# Patient Record
Sex: Female | Born: 1960 | Race: White | Hispanic: No | Marital: Married | Smoking: Never smoker
Health system: Southern US, Community
[De-identification: ages and names within clinical notes are randomized; demographics above are authoritative.]

## PROBLEM LIST (undated history)

## (undated) DIAGNOSIS — F419 Anxiety disorder, unspecified: Secondary | ICD-10-CM

## (undated) DIAGNOSIS — M1990 Unspecified osteoarthritis, unspecified site: Secondary | ICD-10-CM

## (undated) DIAGNOSIS — E785 Hyperlipidemia, unspecified: Secondary | ICD-10-CM

## (undated) DIAGNOSIS — R011 Cardiac murmur, unspecified: Secondary | ICD-10-CM

## (undated) DIAGNOSIS — E063 Autoimmune thyroiditis: Secondary | ICD-10-CM

## (undated) DIAGNOSIS — F32A Depression, unspecified: Secondary | ICD-10-CM

## (undated) DIAGNOSIS — I4510 Unspecified right bundle-branch block: Secondary | ICD-10-CM

## (undated) HISTORY — DX: Cardiac murmur, unspecified: R01.1

## (undated) HISTORY — PX: PARTIAL HYSTERECTOMY: SHX80

## (undated) HISTORY — PX: SPINAL FUSION: SHX223

## (undated) HISTORY — PX: ABDOMINAL HYSTERECTOMY: SHX81

## (undated) HISTORY — PX: OTHER SURGICAL HISTORY: SHX169

## (undated) HISTORY — DX: Hyperlipidemia, unspecified: E78.5

## (undated) HISTORY — PX: APPENDECTOMY: SHX54

## (undated) HISTORY — DX: Unspecified right bundle-branch block: I45.10

## (undated) HISTORY — PX: TOTAL HIP ARTHROPLASTY: SHX124

---

## 2011-03-07 DIAGNOSIS — I4510 Unspecified right bundle-branch block: Secondary | ICD-10-CM

## 2011-03-07 HISTORY — DX: Unspecified right bundle-branch block: I45.10

## 2013-03-13 HISTORY — PX: TOTAL HIP ARTHROPLASTY: SHX124

## 2015-11-18 HISTORY — PX: HIP HARDWARE REMOVAL: SUR1127

## 2016-03-03 HISTORY — PX: REIMPLANTATION OF TOTAL HIP: SHX6051

## 2016-09-06 ENCOUNTER — Emergency Department (HOSPITAL_COMMUNITY)
Admission: EM | Admit: 2016-09-06 | Discharge: 2016-09-06 | Disposition: A | Discharge status: Home / Self Care | Payer: BLUE CROSS/BLUE SHIELD | Attending: Emergency Medicine | Admitting: Emergency Medicine

## 2016-09-06 ENCOUNTER — Encounter (HOSPITAL_COMMUNITY): Payer: Self-pay | Admitting: Vascular Surgery

## 2016-09-06 DIAGNOSIS — S51811A Laceration without foreign body of right forearm, initial encounter: Secondary | ICD-10-CM | POA: Insufficient documentation

## 2016-09-06 DIAGNOSIS — Z23 Encounter for immunization: Secondary | ICD-10-CM | POA: Insufficient documentation

## 2016-09-06 DIAGNOSIS — W19XXXA Unspecified fall, initial encounter: Secondary | ICD-10-CM | POA: Diagnosis not present

## 2016-09-06 DIAGNOSIS — Y998 Other external cause status: Secondary | ICD-10-CM | POA: Insufficient documentation

## 2016-09-06 DIAGNOSIS — Y929 Unspecified place or not applicable: Secondary | ICD-10-CM | POA: Insufficient documentation

## 2016-09-06 DIAGNOSIS — Y939 Activity, unspecified: Secondary | ICD-10-CM | POA: Insufficient documentation

## 2016-09-06 MED ORDER — TETANUS-DIPHTH-ACELL PERTUSSIS 5-2.5-18.5 LF-MCG/0.5 IM SUSP
0.5000 mL | Freq: Once | INTRAMUSCULAR | Status: AC
  Administered 2016-09-06: 0.5 mL via INTRAMUSCULAR
  Filled 2016-09-06: qty 0.5

## 2016-09-06 MED ORDER — LIDOCAINE HCL (PF) 1 % IJ SOLN
5.0000 mL | Freq: Once | INTRAMUSCULAR | Status: AC
  Administered 2016-09-06: 5 mL
  Filled 2016-09-06: qty 5

## 2016-09-06 NOTE — ED Notes (Signed)
Pt stepped out of room for second time, asking where the provider was. "Just wanted to make sure y'all didn't forget about me." Arm began bleeding again while pt leaning on door frame. Blood on wall and floor. Pt instructed to remain in chair with arm on bedside table covered by chux pad. Lac cleaned up again.

## 2016-09-06 NOTE — ED Notes (Signed)
Pt departed in NAD, refused use of wheelchair.  

## 2016-09-06 NOTE — ED Provider Notes (Signed)
[facility]-EMERGENCY DEPT Provider Note   By signing my name below, I, Earmon PhoenixJennifer Waddell, attest that this documentation has been prepared under the direction and in the presence of [facility] Latisa Belay, [place]. Electronically Signed: Earmon PhoenixJennifer Waddell, ED Scribe. 09/06/16. 9:22 PM.    History   Chief Complaint Chief Complaint  Patient presents with  . Extremity Laceration   The history is provided by the patient and medical records. No language interpreter was used.    Alycia Pattenoni Ridge is a 56 y.o. female who presents to the Emergency Department complaining of a mechanical fall that occurred PTA. She reports a laceration to the right forearm. She admits to alcohol use which caused the fall. She reports associated bleeding which has been controlled. She has not taken anything for pain. There are no modifying factors noted. She denies numbness, tingling or weakness of the RUE, head trauma, LOC. She is unsure of her last tetanus vaccination. She is not currently on anticoagulant therapy. She denies allergies to any medications.    History reviewed. No pertinent past medical history.  There are no active problems to display for this patient.   History reviewed. No pertinent surgical history.  OB History    No data available       Home Medications    Prior to Admission medications   Not on File    Family History No family history on file.  Social History Social History  Substance Use Topics  . Smoking status: Never Smoker  . Smokeless tobacco: Never Used  . Alcohol use Yes     Comment: daily (2 drinks a day)      Allergies   Patient has no allergy information on record.   Review of Systems Review of Systems  Constitutional: Negative for activity change.  Gastrointestinal: Negative for vomiting.  Skin: Positive for wound.  Neurological: Negative for syncope, weakness and numbness.     Physical Exam Updated Vital Signs BP 139/73 (BP Location: Right Arm)   Pulse 93   Temp 98.8 F  (37.1 C) (Oral)   Resp 16   SpO2 98%   Physical Exam  Constitutional: She appears well-developed and well-nourished. No distress.  HENT:  Head: Normocephalic.  Eyes: EOM are normal.  Neck: Neck supple.  Cardiovascular: Normal rate.   Pulmonary/Chest: Effort normal.  Musculoskeletal: Normal range of motion.  3 cm laceration to the palmar aspect of the right forearm. Full ROM of right wrist, forearm, elbow, shoulder.  Neurological: She is alert.  Good strength in RUE.  Skin: Skin is warm and dry.  Psychiatric: She has a normal mood and affect. Her behavior is normal.  Nursing note and vitals reviewed.    ED Treatments / Results  DIAGNOSTIC STUDIES: Oxygen Saturation is 98% on RA, normal by my interpretation.   COORDINATION OF CARE: 9:01 PM- Will update tetanus vaccination and suture wound. Pt verbalizes understanding and agrees to plan.  Medications  lidocaine (PF) (XYLOCAINE) 1 % injection 5 mL (5 mLs Infiltration Given 09/06/16 2124)  Tdap (BOOSTRIX) injection 0.5 mL (0.5 mLs Intramuscular Given 09/06/16 2124)   Radiology No results found.  Procedures .Marland Kitchen.Laceration Repair Date/Time: 09/06/2016 9:03 PM Performed by: Janne NapoleonNEESE, Demarlo Riojas M Authorized by: Janne NapoleonNEESE, Abelina Ketron M   Consent:    Consent obtained:  Verbal   Consent given by:  Patient   Risks discussed:  Poor cosmetic result and pain   Alternatives discussed:  No treatment Anesthesia (see MAR for exact dosages):    Anesthesia method:  Local infiltration  Local anesthetic:  Lidocaine 1% w/o epi Laceration details:    Location:  Shoulder/arm   Shoulder/arm location:  R lower arm   Length (cm):  3 Repair type:    Repair type:  Simple Pre-procedure details:    Preparation:  Patient was prepped and draped in usual sterile fashion Exploration:    Hemostasis achieved with:  Direct pressure   Wound exploration: wound explored through full range of motion and entire depth of wound probed and visualized     Wound extent: no  foreign bodies/material noted and no tendon damage noted     Contaminated: no   Treatment:    Area cleansed with:  Betadine   Amount of cleaning:  Standard   Irrigation solution:  Sterile saline   Irrigation method:  Syringe Skin repair:    Repair method:  Sutures   Suture size:  5-0   Suture material:  Prolene   Suture technique:  Simple interrupted   Number of sutures:  4 Approximation:    Approximation:  Close Post-procedure details:    Dressing:  Sterile dressing and antibiotic ointment   Patient tolerance of procedure:  Tolerated well, no immediate complications    (including critical care time)  Medications Ordered in ED Medications  lidocaine (PF) (XYLOCAINE) 1 % injection 5 mL (5 mLs Infiltration Given 09/06/16 2124)  Tdap (BOOSTRIX) injection 0.5 mL (0.5 mLs Intramuscular Given 09/06/16 2124)     Initial Impression / Assessment and Plan / ED Course  I have reviewed the triage vital signs and the nursing notes.   Tetanus updated in ED. Laceration occurred < 12 hours prior to repair. Discussed laceration care with pt and answered questions. Pt to f-u for suture removal in 7 to 10 days and wound check sooner should there be signs of dehiscence or infection. Pt is hemodynamically stable with no complaints prior to dc.     Final Clinical Impressions(s) / ED Diagnoses   Final diagnoses:  Laceration of right forearm, initial encounter    New Prescriptions New Prescriptions   No medications on file   I personally performed the services described in this documentation, which was scribed in my presence. The recorded information has been reviewed and is accurate.    Kerrie Buffalo [place], [place] 09/06/16 2134    Lavera Guise, MD 09/06/16 [phone]

## 2016-09-06 NOTE — ED Triage Notes (Signed)
Pt reports to the ED for eval of a laceration to her right arm. Bleeding controlled at this time. Pt states she tripped and fell. Pt is not sure when her last tetanus shot was. + ETOH on board.

## 2016-09-06 NOTE — ED Notes (Signed)
Applied wound cleanser to injury area. No hemorrhage noted at this time.

## 2016-09-06 NOTE — ED Notes (Signed)
ED Provider at bedside. 

## 2016-12-28 ENCOUNTER — Encounter (INDEPENDENT_AMBULATORY_CARE_PROVIDER_SITE_OTHER): Payer: Self-pay

## 2016-12-28 ENCOUNTER — Ambulatory Visit (INDEPENDENT_AMBULATORY_CARE_PROVIDER_SITE_OTHER): Payer: BLUE CROSS/BLUE SHIELD | Admitting: Cardiology

## 2016-12-28 ENCOUNTER — Encounter: Payer: Self-pay | Admitting: Cardiology

## 2016-12-28 VITALS — BP 140/100 | HR 73 | Ht 65.0 in | Wt 168.0 lb

## 2016-12-28 DIAGNOSIS — R079 Chest pain, unspecified: Secondary | ICD-10-CM | POA: Diagnosis not present

## 2016-12-28 DIAGNOSIS — B028 Zoster with other complications: Secondary | ICD-10-CM | POA: Diagnosis not present

## 2016-12-28 MED ORDER — ACYCLOVIR 400 MG PO TABS: 800.0000 mg | ORAL_TABLET | Freq: Every day | ORAL | 0 refills | Status: DC

## 2016-12-28 NOTE — Patient Instructions (Signed)
Start acyclovir 800 mg 5 times a day for 7 days  Follow up with your primary care in 2-3 weeks.  I will see you prn

## 2016-12-28 NOTE — Progress Notes (Signed)
Cardiology Office Note   Date:  12/28/2016   ID:  Erica Rowland, DOB 09-Jun-1960, MRN [id number]  PCP:  Eunice Blase'Buch, Greta, PA-C  Cardiologist:   Rianna Lukes [place], MD   Chief Complaint  Patient presents with  . Follow-up  . Chest Pain  . Headache      History of Present Illness: Erica Rowland is a 56 y.o. female who is seen at the request of  Dr. Lorene Dy'Buch   For evaluation of chest pain. She was [facility] with atypical chest pain. Ecg showed NSR with a RBBB which is apparently chronic. CTA chest negative for PE. troponins normal. 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 TEE was normal. No vegetation. Normal valves.  She describes her pain as sharp, stabbing and somewhat spasmodic. Awoke her from sleep Monday 4 am. Seen in ED as noted and told to take ibuprofen. Went back to ED on Tuesday when pain worse. Got some temporary relief with IV Toradol and Fentanyl. Pain began in left pectoral region then her left elbow hurt then her shoulder. Now pain more central. She developed a rash on her left arm and thought she had poison ivy. She feels achy but no fever.   Past Medical History:  Diagnosis Date  . Murmur   . RBBB     Past Surgical History:  Procedure Laterality Date  . APPENDECTOMY    . PARTIAL HYSTERECTOMY    . revision of left total hip    . SPINAL FUSION    . TOTAL HIP ARTHROPLASTY Left      Current Outpatient Prescriptions  Medication Sig Dispense Refill  . ALPHA LIPOIC ACID PO Take 1 tablet by mouth daily.    Marland Kitchen. aspirin 81 MG tablet Take 81 mg by mouth daily.    Marland Kitchen. buPROPion (WELLBUTRIN XL) 300 MG 24 hr tablet Take 300 mg by mouth daily.    . cefdinir (OMNICEF) 300 MG capsule Take 300 mg by mouth as needed.    . celecoxib (CELEBREX) 200 MG capsule Take 200 mg by mouth daily.    . Cholecalciferol (VITAMIN D3 PO) Take 1 tablet by mouth daily.    . clonazePAM (KLONOPIN) 0.5 MG tablet Take 0.5 mg  by mouth as needed for anxiety.    Marland Kitchen. escitalopram (LEXAPRO) 10 MG tablet Take 10 mg by mouth daily.    Marland Kitchen. GARCINIA CAMBOGIA-CHROMIUM PO Take 1 tablet by mouth daily.    Marland Kitchen. GINSENG PO Take 1 tablet by mouth daily.    Marland Kitchen. levothyroxine (SYNTHROID, LEVOTHROID) 88 MCG tablet Take 88 mcg by mouth daily.    . Loratadine-Pseudoephedrine (CLARITIN-D 12 HOUR PO) Take 1 tablet by mouth 2 (two) times daily.    . meloxicam (MOBIC) 15 MG tablet Take 15 mg by mouth as needed.    Marland Kitchen. NIACIN PO Take 1 tablet by mouth daily.    Marland Kitchen. omeprazole (PRILOSEC) 40 MG capsule Take 40 mg by mouth as needed.    . pregabalin (LYRICA) 75 MG capsule Take 75 mg by mouth as needed.    . promethazine (PHENERGAN) 25 MG tablet Take 25 mg by mouth as needed.    . Pseudoephedrine-Ibuprofen (ADVIL COLD & SINUS LIQUI-GELS PO) Take 1 tablet by mouth daily.    . temazepam (RESTORIL) 15 MG capsule Take 15 mg by mouth daily.    . valACYclovir (VALTREX) 1000 MG tablet Take 1,000 mg by mouth as  needed.    . vitamin B-12 (CYANOCOBALAMIN) 500 MCG tablet Take 500 mcg by mouth daily.     No current facility-administered medications for this visit.     Allergies:   Rifampin    Social History:  The patient  reports that she has never smoked. She has never used smokeless tobacco. She reports that she drinks alcohol. She reports that she does not use drugs.   Family History:  The patient's family history includes Cancer in her father; Dementia in her mother; Heart attack in her mother.    ROS:  Please see the history of present illness.   Otherwise, review of systems are positive for none.   All other systems are reviewed and negative.    PHYSICAL EXAM: VS:  BP (!) 140/100   Pulse 73   Ht 5\' 5"  (1.651 m)   Wt 168 lb (76.2 kg)   BMI 27.96 kg/m  , BMI Body mass index is 27.96 kg/m. GEN: Well nourished, well developed, in no acute distress  HEENT: normal  Neck: no JVD, carotid bruits, or masses Cardiac: RRR; no murmurs, rubs, or gallops,no  edema  Respiratory:  clear to auscultation bilaterally, normal work of breathing GI: soft, nontender, nondistended, + BS MS: no deformity or atrophy  Skin: warm and dry, she has a clustered vesicular rash in the mid left parasternal region. Also smaller clusters in the left axilla and the medial side of her left arm to her wrist.  Neuro:  Strength and sensation are intact Psych: euthymic mood, full affect   EKG:  EKG is not ordered today. The ekg ordered 12/26/16 demonstrates NSR with RBBB. I have personally reviewed and interpreted this study.    Recent Labs: No results found for requested labs within last 8760 hours.    Lipid Panel No results found for: CHOL, TRIG, HDL, CHOLHDL, VLDL, LDLCALC, LDLDIRECT    Wt Readings from Last 3 Encounters:  12/28/16 168 lb (76.2 kg)      Other studies Reviewed: Additional studies/ records that were reviewed today include: Labs, Ecg, CXR and CT chest reviewed from ED visit this week and negative except for RBBB.    ASSESSMENT AND PLAN:  1.  Chest pain secondary to herpes zoster infection. Typical vesicular rash in dermatomal fashion. Recommend acyclovir 800 mg 5 times daily for one week. Recommend follow up with primary care. Once she recovers from this episode will need the Shingrix vaccine.  2. RBBB. Chronic and benign.    Current medicines are reviewed at length with the patient today.  The patient does not have concerns regarding medicines.  The following changes have been made:  See above  Labs/ tests ordered today include:none No orders of the defined types were placed in this encounter.    Disposition:   FU with me PRN. Needs to follow up with primary care in 2-3 weeks.   Signed, Cloee Dunwoody [place], MD  12/28/2016 3:27 PM    [facility] [address], [place], [place], [address] Phone [phone], Fax [phone]

## 2017-03-18 HISTORY — PX: TOTAL HIP ARTHROPLASTY: SHX124

## 2017-04-06 ENCOUNTER — Other Ambulatory Visit: Payer: Self-pay

## 2017-04-06 ENCOUNTER — Emergency Department (HOSPITAL_BASED_OUTPATIENT_CLINIC_OR_DEPARTMENT_OTHER): Payer: BLUE CROSS/BLUE SHIELD

## 2017-04-06 ENCOUNTER — Emergency Department (HOSPITAL_BASED_OUTPATIENT_CLINIC_OR_DEPARTMENT_OTHER)
Admission: EM | Admit: 2017-04-06 | Discharge: 2017-04-06 | Disposition: A | Discharge status: Home / Self Care | Payer: BLUE CROSS/BLUE SHIELD | Attending: Emergency Medicine | Admitting: Emergency Medicine

## 2017-04-06 ENCOUNTER — Encounter (HOSPITAL_BASED_OUTPATIENT_CLINIC_OR_DEPARTMENT_OTHER): Payer: Self-pay

## 2017-04-06 DIAGNOSIS — Z79899 Other long term (current) drug therapy: Secondary | ICD-10-CM | POA: Insufficient documentation

## 2017-04-06 DIAGNOSIS — Y9285 Railroad track as the place of occurrence of the external cause: Secondary | ICD-10-CM | POA: Insufficient documentation

## 2017-04-06 DIAGNOSIS — W010XXA Fall on same level from slipping, tripping and stumbling without subsequent striking against object, initial encounter: Secondary | ICD-10-CM | POA: Insufficient documentation

## 2017-04-06 DIAGNOSIS — Y999 Unspecified external cause status: Secondary | ICD-10-CM | POA: Insufficient documentation

## 2017-04-06 DIAGNOSIS — Z7982 Long term (current) use of aspirin: Secondary | ICD-10-CM | POA: Insufficient documentation

## 2017-04-06 DIAGNOSIS — Y939 Activity, unspecified: Secondary | ICD-10-CM | POA: Insufficient documentation

## 2017-04-06 DIAGNOSIS — S022XXA Fracture of nasal bones, initial encounter for closed fracture: Secondary | ICD-10-CM | POA: Diagnosis not present

## 2017-04-06 DIAGNOSIS — Y929 Unspecified place or not applicable: Secondary | ICD-10-CM | POA: Insufficient documentation

## 2017-04-06 DIAGNOSIS — Z96649 Presence of unspecified artificial hip joint: Secondary | ICD-10-CM | POA: Insufficient documentation

## 2017-04-06 DIAGNOSIS — S0990XA Unspecified injury of head, initial encounter: Secondary | ICD-10-CM | POA: Diagnosis not present

## 2017-04-06 MED ORDER — ACETAMINOPHEN 500 MG PO TABS
1000.0000 mg | ORAL_TABLET | Freq: Once | ORAL | Status: AC
  Administered 2017-04-06: 1000 mg via ORAL
  Filled 2017-04-06: qty 2

## 2017-04-06 NOTE — ED Triage Notes (Signed)
Pt states she tripped on railroad track approx 8pm last night-abrasion to forehead, swelling/bruising to nose-no LOC-NAD-steady gait

## 2017-04-06 NOTE — ED Notes (Signed)
Patient transported to CT 

## 2017-04-06 NOTE — ED Notes (Signed)
Pt ambulatory with steady gait at dc.

## 2017-04-06 NOTE — ED Provider Notes (Signed)
[facility] EMERGENCY DEPARTMENT Provider Note   CSN: [id number] Arrival date & time: 04/06/17  1233     History   Chief Complaint Chief Complaint  Patient presents with  . Fall    HPI Erica Rowland is a 57 y.o. female. She has history of heart murmur, OA, and repeat hip replacements, most recently at the beginning of January for right hip.   HPI  Patient presents after fall yesterday evening during which she struck her head and has had swelling of her forehead and nasal bridge. She reports mechanical fall when crossing train track along path to parking lot after going out to dinner. Says she "face-planted." She was with a friend who witnessed the event. Patient did not lose consciousness but says it took her a couple minutes to get up afterwards. She does report drinking 2 beers at dinner but thinks fall a consequence of legs getting tired towards end of the day, especially as she participates in PT as part of recovery form recent hip replacement. She is not requiring any assistance with walking and denies injuring her hip during the fall. She had bleeding mostly from her nose after the fall that resolved after holding pressure with a baby diaper (what friend had available). She is only on aspirin after her surgery.   She has had a headache across the top of her head that improved some with tylenol. She applied a cool towel last night, slept propped up, and set a timer on her phone to wake her up every 2 hours because she was worried after reading about concussions online. She says she has no retrograde or anterograde memory loss. She presents today due to continued swelling of her nose and to make sure the injury was not more serious than she assumed. She says the resulting knot on her forehead has improved.   Past Medical History:  Diagnosis Date  . Murmur   . RBBB     There are no active problems to display for this patient.   Past Surgical History:  Procedure Laterality  Date  . ABDOMINAL HYSTERECTOMY    . APPENDECTOMY    . PARTIAL HYSTERECTOMY    . revision of left total hip    . SPINAL FUSION    . TOTAL HIP ARTHROPLASTY Left     OB History    No data available       Home Medications    Prior to Admission medications   Medication Sig Start Date End Date Taking? Authorizing Provider  acyclovir (ZOVIRAX) 400 MG tablet Take 2 tablets (800 mg total) by mouth 5 (five) times daily. 12/28/16   [place], Peter M, MD  ALPHA LIPOIC ACID PO Take 1 tablet by mouth daily.    [provider]  aspirin 81 MG tablet Take 81 mg by mouth 2 (two) times daily.     [provider]  buPROPion (WELLBUTRIN XL) 300 MG 24 hr tablet Take 300 mg by mouth daily. 05/04/10   [provider]  cefdinir (OMNICEF) 300 MG capsule Take 300 mg by mouth as needed.    [provider]  celecoxib (CELEBREX) 200 MG capsule Take 200 mg by mouth daily. 04/11/16   [provider]  Cholecalciferol (VITAMIN D3 PO) Take 1 tablet by mouth daily.    [provider]  clonazePAM (KLONOPIN) 0.5 MG tablet Take 0.5 mg by mouth as needed for anxiety.    [provider]  escitalopram (LEXAPRO) 10 MG tablet Take 10  mg by mouth daily. 05/20/15   [provider]  GARCINIA CAMBOGIA-CHROMIUM PO Take 1 tablet by mouth daily.    [provider]  GINSENG PO Take 1 tablet by mouth daily.    [provider]  levothyroxine (SYNTHROID, LEVOTHROID) 88 MCG tablet Take 88 mcg by mouth daily. 09/21/15   [provider]  Loratadine-Pseudoephedrine (CLARITIN-D 12 HOUR PO) Take 1 tablet by mouth 2 (two) times daily.    [provider]  meloxicam (MOBIC) 15 MG tablet Take 15 mg by mouth as needed.    [provider]  NIACIN PO Take 1 tablet by mouth daily.    [provider]  omeprazole (PRILOSEC) 40 MG capsule Take 40 mg by mouth as needed.    [provider]  pregabalin (LYRICA) 75 MG capsule Take  75 mg by mouth as needed.    [provider]  promethazine (PHENERGAN) 25 MG tablet Take 25 mg by mouth as needed.    [provider]  Pseudoephedrine-Ibuprofen (ADVIL COLD & SINUS LIQUI-GELS PO) Take 1 tablet by mouth daily.    [provider]  temazepam (RESTORIL) 15 MG capsule Take 15 mg by mouth daily. 04/13/16   [provider]  valACYclovir (VALTREX) 1000 MG tablet Take 1,000 mg by mouth as needed.    [provider]  vitamin B-12 (CYANOCOBALAMIN) 500 MCG tablet Take 500 mcg by mouth daily.    [provider]    Family History Family History  Problem Relation Age of Onset  . Dementia Mother   . Heart attack Mother   . Cancer Father     Social History Social History   Tobacco Use  . Smoking status: Never Smoker  . Smokeless tobacco: Never Used  Substance Use Topics  . Alcohol use: Yes    Comment: weekly   . Drug use: No     Allergies   Rifampin   Review of Systems Review of Systems  Constitutional: Negative for fever.  HENT: Positive for nosebleeds and sinus pressure.   Eyes: Negative for discharge and visual disturbance.  Respiratory: Negative for shortness of breath.   Cardiovascular: Negative for chest pain.  Gastrointestinal: Negative for abdominal pain, nausea and vomiting.  Musculoskeletal: Positive for neck pain ("feels tight"). Negative for neck stiffness.  Neurological: Positive for headaches. Negative for dizziness and weakness.     Physical Exam Updated Vital Signs BP (!) 154/75 (BP Location: Right Arm)   Pulse 71   Temp 99 F (37.2 C) (Oral)   Resp 16   Ht 5\' 5"  (1.651 m)   Wt 75.9 kg (167 lb 5.3 oz)   SpO2 100%   BMI 27.85 kg/m   Physical Exam  Constitutional: She is oriented to person, place, and time. She appears well-developed and well-nourished. No distress.  HENT:  Head: Normocephalic.  Bruising under eyes mostly adjacent to nose but also along upper nasal bridge. Dried blood  present in nose. No blood behind TMs. Widening and swelling of nasal bridge with pain upon palpation. Abrasion to R forehead with swelling.   Eyes: Conjunctivae and EOM are normal. Pupils are equal, round, and reactive to light.  Neck: Normal range of motion.  No midline spinal tenderness to palpation.  Neurological: She is alert and oriented to person, place, and time. No cranial nerve deficit or sensory deficit. She exhibits normal muscle tone. Coordination normal.  Visual fields intact.   Skin:  Bruising surrounding eyes, as noted above, but not involving upper  eyelids.   Psychiatric: She has a normal mood and affect.     ED Treatments / Results  Labs (all labs ordered are listed, but only abnormal results are displayed) Labs Reviewed - No data to display  EKG  EKG Interpretation None       Radiology Ct Head Wo Contrast  Result Date: 04/06/2017 CLINICAL DATA:  Pt states she tripped on railroad track approx 8pm last night-abrasion to forehead, swelling/bruising to nose-no LOC-NAD-steady gaitComplains of nose pain EXAM: CT HEAD WITHOUT CONTRAST TECHNIQUE: Contiguous axial images were obtained from the base of the skull through the vertex without intravenous contrast. COMPARISON:  None. FINDINGS: Brain: No evidence of acute infarction, hemorrhage, hydrocephalus, extra-axial collection or mass lesion/mass effect. Vascular: No hyperdense vessel or unexpected calcification. Skull: Normal. Negative for fracture or focal lesion. Sinuses/Orbits: Globes orbits are unremarkable. Visualized sinuses and mastoid air cells are clear. Other: Nondisplaced fracture of the left nasal bone. IMPRESSION: 1. No intracranial abnormality.  No skull fracture. 2. Nondisplaced fracture of the left nasal bone. Electronically Signed   By: Amie Portland M.D.   On: 04/06/2017 14:51    Procedures Procedures (including critical care time)  Medications Ordered in ED Medications  acetaminophen (TYLENOL) tablet  1,000 mg (1,000 mg Oral Given 04/06/17 1345)     Initial Impression / Assessment and Plan / ED Course  I have reviewed the triage vital signs and the nursing notes.  Pertinent labs & imaging results that were available during my care of the patient were reviewed by me and considered in my medical decision making (see chart for details).  Tylenol given for headache.  CT head ordered to rule out skull fracture given extent of periorbital ecchymosis. This was negative for skull fracture, including orbits. Non-displaced fracture of left nasal bone.    Final Clinical Impressions(s) / ED Diagnoses   Final diagnoses:  Closed fracture of nasal bone, initial encounter   Patient with abrasion to forehead and swelling of nasal bridge secondary to nondisplaced fracture of left nasal bone. Neurological exam normal and no loss of consciousness surrounding incident, decreasing concern for concussion. Recommended icing areas of swelling (bag of frozen peas) and using afrin if nosebleeds recur. Have referred to ENT with instructions to be seen next week if patient decides to have nasal fracture further addressed.   ED Discharge Orders    None     Dani Gobble, MD [facility], PGY-3    Casey Burkitt, MD 04/06/17 2223    Alvira Monday, MD 04/08/17 [phone]

## 2017-04-06 NOTE — ED Notes (Signed)
ED Provider at bedside. 

## 2017-04-06 NOTE — Discharge Instructions (Addendum)
Ms. Gretchen PortelaRidge,  You have been referred to ENT (Dr. Jearld FentonByers). Please call their office at to make sure referral went through. Phone number is [phone].   Continue ibuprofen and tylenol as needed for headache. Ice areas of swelling (a bag of frozen peas usually works well).   If you have more bleeding from the nose, try afrin -- this is available over the counter and constricts blood vessels. Use no more frequently than 3 days in a row to avoid rebound nasal congestion.

## 2017-04-10 DIAGNOSIS — J3489 Other specified disorders of nose and nasal sinuses: Secondary | ICD-10-CM | POA: Diagnosis not present

## 2017-04-27 DIAGNOSIS — Z1231 Encounter for screening mammogram for malignant neoplasm of breast: Secondary | ICD-10-CM | POA: Diagnosis not present

## 2017-04-30 DIAGNOSIS — Z471 Aftercare following joint replacement surgery: Secondary | ICD-10-CM | POA: Diagnosis not present

## 2017-04-30 DIAGNOSIS — Z96643 Presence of artificial hip joint, bilateral: Secondary | ICD-10-CM | POA: Diagnosis not present

## 2017-04-30 DIAGNOSIS — Z96641 Presence of right artificial hip joint: Secondary | ICD-10-CM | POA: Diagnosis not present

## 2017-05-14 DIAGNOSIS — G8929 Other chronic pain: Secondary | ICD-10-CM | POA: Diagnosis not present

## 2017-05-14 DIAGNOSIS — Z6828 Body mass index (BMI) 28.0-28.9, adult: Secondary | ICD-10-CM | POA: Diagnosis not present

## 2017-05-14 DIAGNOSIS — G4700 Insomnia, unspecified: Secondary | ICD-10-CM | POA: Diagnosis not present

## 2017-08-31 DIAGNOSIS — F419 Anxiety disorder, unspecified: Secondary | ICD-10-CM | POA: Diagnosis not present

## 2017-08-31 DIAGNOSIS — B001 Herpesviral vesicular dermatitis: Secondary | ICD-10-CM | POA: Diagnosis not present

## 2017-08-31 DIAGNOSIS — Z79899 Other long term (current) drug therapy: Secondary | ICD-10-CM | POA: Diagnosis not present

## 2017-08-31 DIAGNOSIS — E063 Autoimmune thyroiditis: Secondary | ICD-10-CM | POA: Diagnosis not present

## 2017-08-31 DIAGNOSIS — G4700 Insomnia, unspecified: Secondary | ICD-10-CM | POA: Diagnosis not present

## 2017-12-31 DIAGNOSIS — F419 Anxiety disorder, unspecified: Secondary | ICD-10-CM | POA: Diagnosis not present

## 2017-12-31 DIAGNOSIS — F329 Major depressive disorder, single episode, unspecified: Secondary | ICD-10-CM | POA: Diagnosis not present

## 2017-12-31 DIAGNOSIS — E063 Autoimmune thyroiditis: Secondary | ICD-10-CM | POA: Diagnosis not present

## 2017-12-31 DIAGNOSIS — G4700 Insomnia, unspecified: Secondary | ICD-10-CM | POA: Diagnosis not present

## 2018-01-23 DIAGNOSIS — E782 Mixed hyperlipidemia: Secondary | ICD-10-CM | POA: Diagnosis not present

## 2018-01-23 DIAGNOSIS — Z1331 Encounter for screening for depression: Secondary | ICD-10-CM | POA: Diagnosis not present

## 2018-01-23 DIAGNOSIS — E063 Autoimmune thyroiditis: Secondary | ICD-10-CM | POA: Diagnosis not present

## 2018-01-23 DIAGNOSIS — R635 Abnormal weight gain: Secondary | ICD-10-CM | POA: Diagnosis not present

## 2018-06-05 DIAGNOSIS — J069 Acute upper respiratory infection, unspecified: Secondary | ICD-10-CM | POA: Diagnosis not present

## 2018-06-05 DIAGNOSIS — H6691 Otitis media, unspecified, right ear: Secondary | ICD-10-CM | POA: Diagnosis not present

## 2018-06-05 DIAGNOSIS — R51 Headache: Secondary | ICD-10-CM | POA: Diagnosis not present

## 2018-07-18 DIAGNOSIS — N760 Acute vaginitis: Secondary | ICD-10-CM | POA: Diagnosis not present

## 2018-07-18 DIAGNOSIS — Z90711 Acquired absence of uterus with remaining cervical stump: Secondary | ICD-10-CM | POA: Diagnosis not present

## 2018-08-09 DIAGNOSIS — G4700 Insomnia, unspecified: Secondary | ICD-10-CM | POA: Diagnosis not present

## 2018-08-09 DIAGNOSIS — E039 Hypothyroidism, unspecified: Secondary | ICD-10-CM | POA: Diagnosis not present

## 2018-08-09 DIAGNOSIS — F411 Generalized anxiety disorder: Secondary | ICD-10-CM | POA: Diagnosis not present

## 2018-08-09 DIAGNOSIS — E559 Vitamin D deficiency, unspecified: Secondary | ICD-10-CM | POA: Diagnosis not present

## 2018-08-20 DIAGNOSIS — F339 Major depressive disorder, recurrent, unspecified: Secondary | ICD-10-CM | POA: Diagnosis not present

## 2018-08-20 DIAGNOSIS — G4700 Insomnia, unspecified: Secondary | ICD-10-CM | POA: Diagnosis not present

## 2018-08-20 DIAGNOSIS — M1990 Unspecified osteoarthritis, unspecified site: Secondary | ICD-10-CM | POA: Diagnosis not present

## 2018-08-20 DIAGNOSIS — F419 Anxiety disorder, unspecified: Secondary | ICD-10-CM | POA: Diagnosis not present

## 2018-08-22 DIAGNOSIS — Z90711 Acquired absence of uterus with remaining cervical stump: Secondary | ICD-10-CM | POA: Diagnosis not present

## 2018-08-22 DIAGNOSIS — Z01419 Encounter for gynecological examination (general) (routine) without abnormal findings: Secondary | ICD-10-CM | POA: Diagnosis not present

## 2018-08-22 DIAGNOSIS — N761 Subacute and chronic vaginitis: Secondary | ICD-10-CM | POA: Diagnosis not present

## 2018-10-02 DIAGNOSIS — N907 Vulvar cyst: Secondary | ICD-10-CM | POA: Diagnosis not present

## 2018-10-02 DIAGNOSIS — Z1231 Encounter for screening mammogram for malignant neoplasm of breast: Secondary | ICD-10-CM | POA: Diagnosis not present

## 2018-12-19 DIAGNOSIS — M542 Cervicalgia: Secondary | ICD-10-CM | POA: Diagnosis not present

## 2018-12-19 DIAGNOSIS — M545 Low back pain: Secondary | ICD-10-CM | POA: Diagnosis not present

## 2018-12-19 DIAGNOSIS — M546 Pain in thoracic spine: Secondary | ICD-10-CM | POA: Diagnosis not present

## 2019-01-07 DIAGNOSIS — M1990 Unspecified osteoarthritis, unspecified site: Secondary | ICD-10-CM | POA: Diagnosis not present

## 2019-01-07 DIAGNOSIS — F419 Anxiety disorder, unspecified: Secondary | ICD-10-CM | POA: Diagnosis not present

## 2019-01-07 DIAGNOSIS — F339 Major depressive disorder, recurrent, unspecified: Secondary | ICD-10-CM | POA: Diagnosis not present

## 2019-01-07 DIAGNOSIS — G4700 Insomnia, unspecified: Secondary | ICD-10-CM | POA: Diagnosis not present

## 2019-01-24 DIAGNOSIS — S0502XA Injury of conjunctiva and corneal abrasion without foreign body, left eye, initial encounter: Secondary | ICD-10-CM | POA: Diagnosis not present

## 2019-04-10 DIAGNOSIS — M545 Low back pain: Secondary | ICD-10-CM | POA: Diagnosis not present

## 2019-04-10 DIAGNOSIS — M542 Cervicalgia: Secondary | ICD-10-CM | POA: Diagnosis not present

## 2019-04-10 DIAGNOSIS — M546 Pain in thoracic spine: Secondary | ICD-10-CM | POA: Diagnosis not present

## 2019-07-23 DIAGNOSIS — M1990 Unspecified osteoarthritis, unspecified site: Secondary | ICD-10-CM | POA: Diagnosis not present

## 2019-07-23 DIAGNOSIS — F339 Major depressive disorder, recurrent, unspecified: Secondary | ICD-10-CM | POA: Diagnosis not present

## 2019-07-23 DIAGNOSIS — F419 Anxiety disorder, unspecified: Secondary | ICD-10-CM | POA: Diagnosis not present

## 2019-07-23 DIAGNOSIS — G4700 Insomnia, unspecified: Secondary | ICD-10-CM | POA: Diagnosis not present

## 2019-08-16 DIAGNOSIS — S61001A Unspecified open wound of right thumb without damage to nail, initial encounter: Secondary | ICD-10-CM | POA: Diagnosis not present

## 2019-09-21 IMAGING — CT CT HEAD W/O CM
3 series · 16 of 47 positions shown, 19 images · non-contrast
Comparison: None.

CLINICAL DATA: Pt states she tripped on railroad track approx 8pm
last night-abrasion to forehead, swelling/bruising to nose-no
LOC-NAD-steady gaitComplains of nose pain

EXAM:
CT HEAD WITHOUT CONTRAST
TECHNIQUE: Contiguous axial images were obtained from the base of the skull
through the vertex without intravenous contrast.

[Series 2: head wo · axial · 0.43mm/px · z∈[-218,-73]mm · 10 of 35 slices shown, 13 images]
[im 3/35  brain]
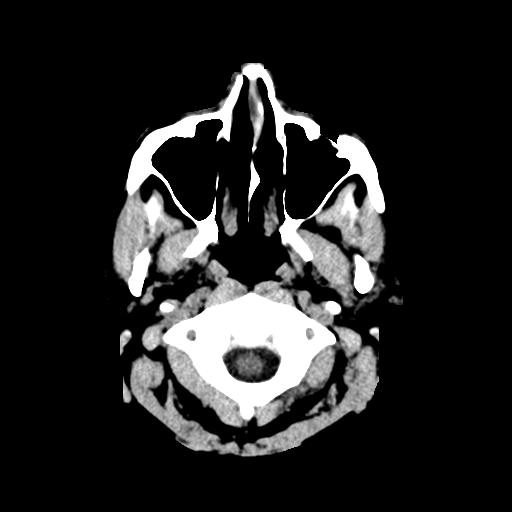
[im 3/35  bone]
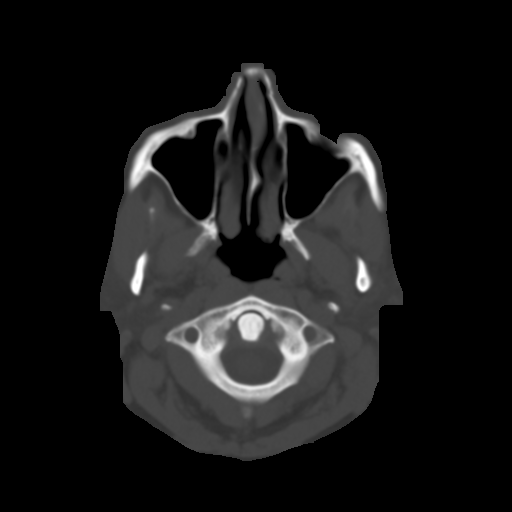
[im 6/35  brain]
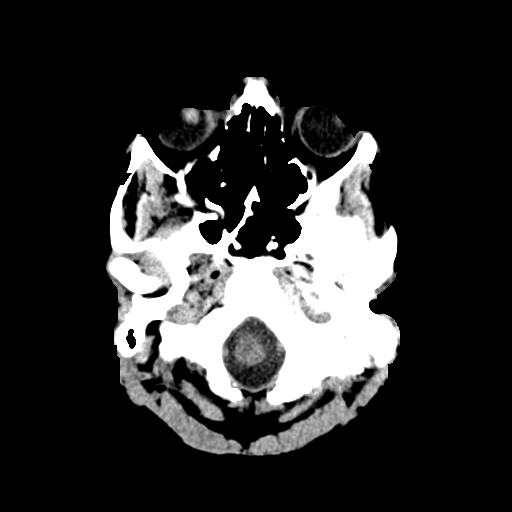
[im 10/35  brain]
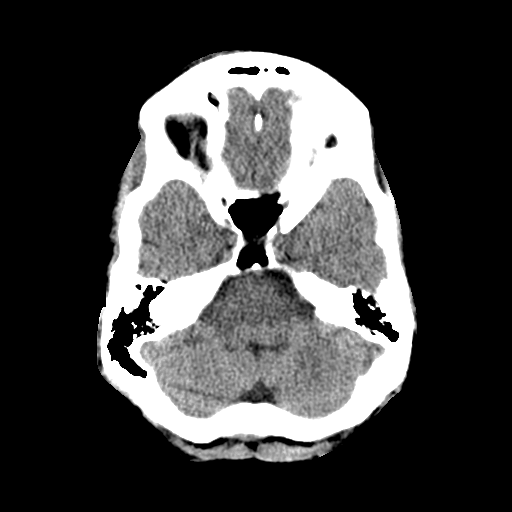
[im 12/35  brain]
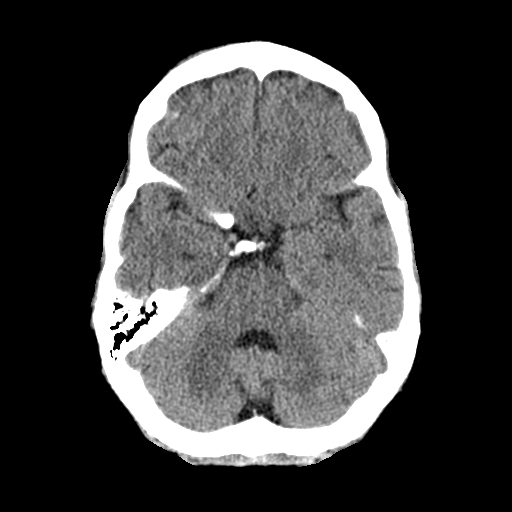
[im 16/35  brain]
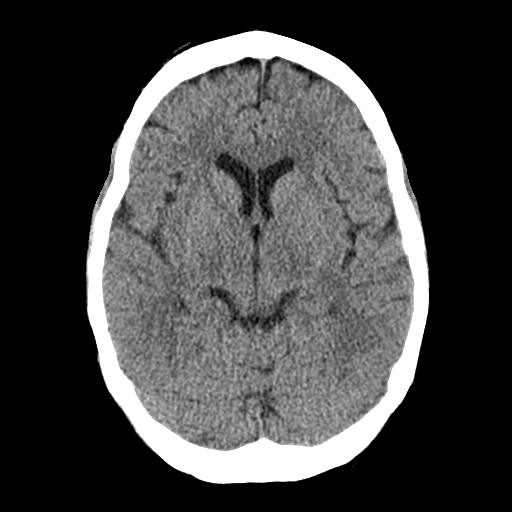
[im 16/35  bone]
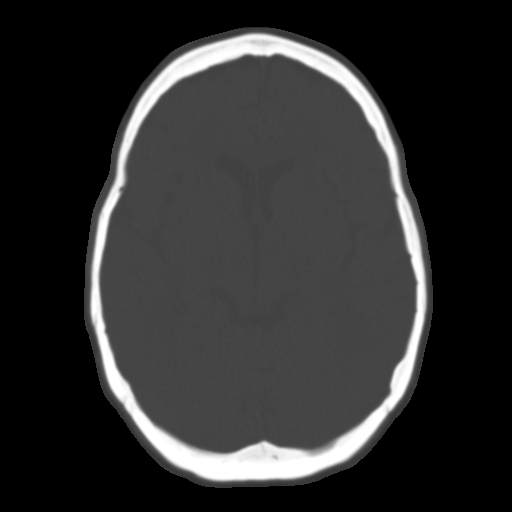
[im 19/35  brain]
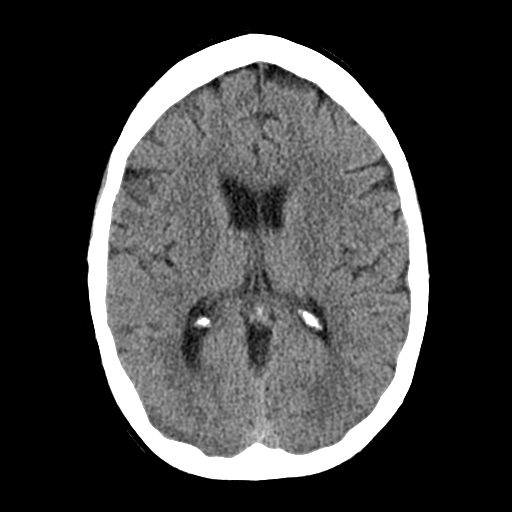
[im 23/35  brain]
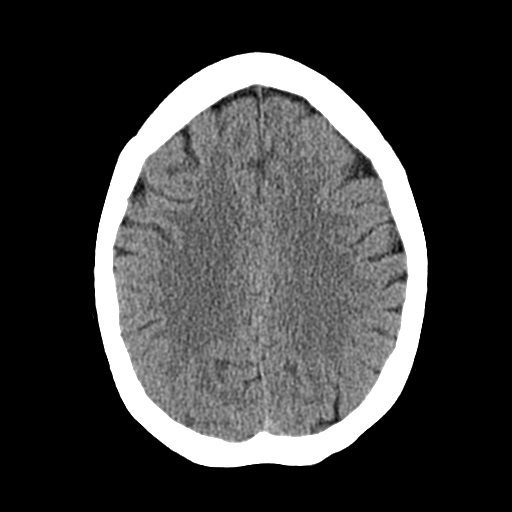
[im 26/35  brain]
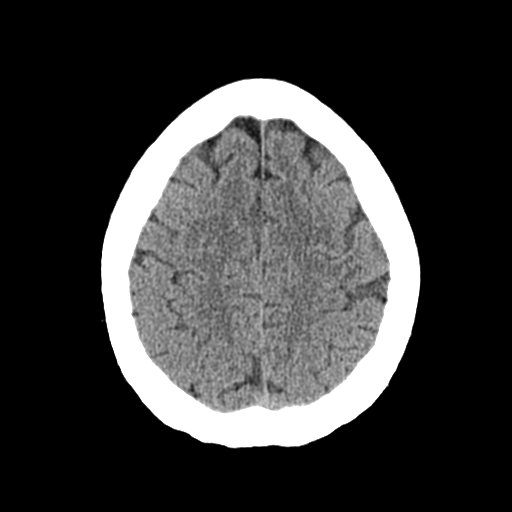
[im 29/35  brain]
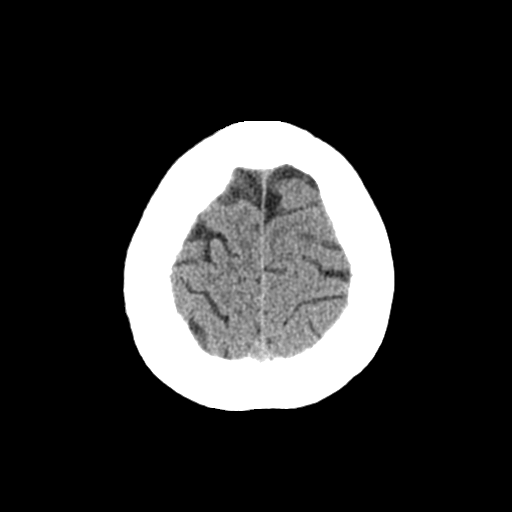
[im 29/35  bone]
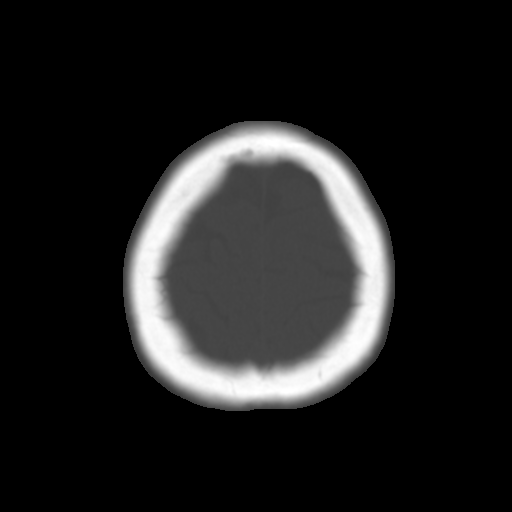
[im 32/35  brain]
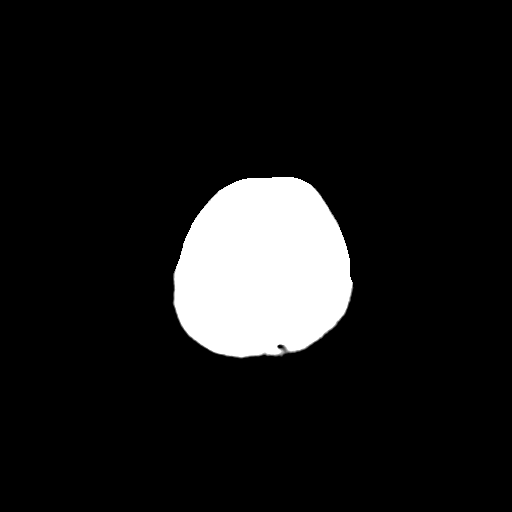

[Series 4: cor soft · coronal · 0.34mm/px · 3 of 74 slices shown]
[im 25/74  brain]
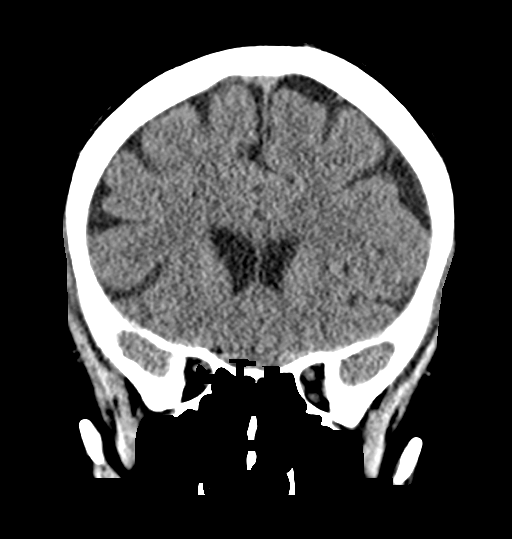
[im 33/74  brain]
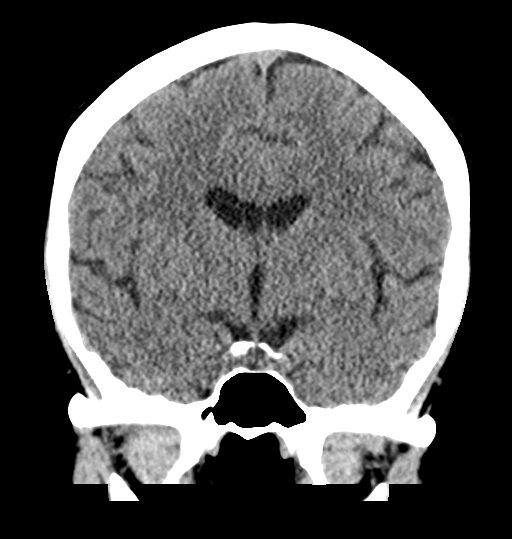
[im 41/74  brain]
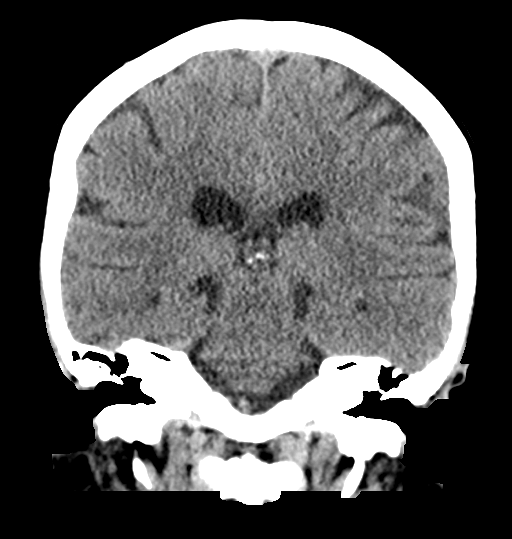

[Series 5: sag soft · sagittal · 0.36mm/px · 3 of 55 slices shown]
[im 19/55  brain]
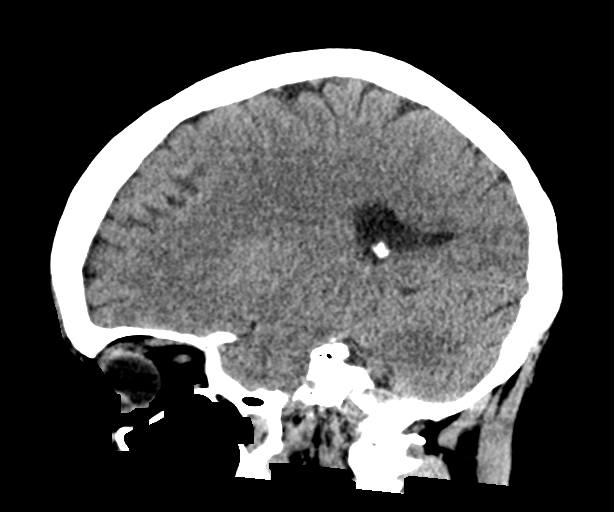
[im 28/55  brain]
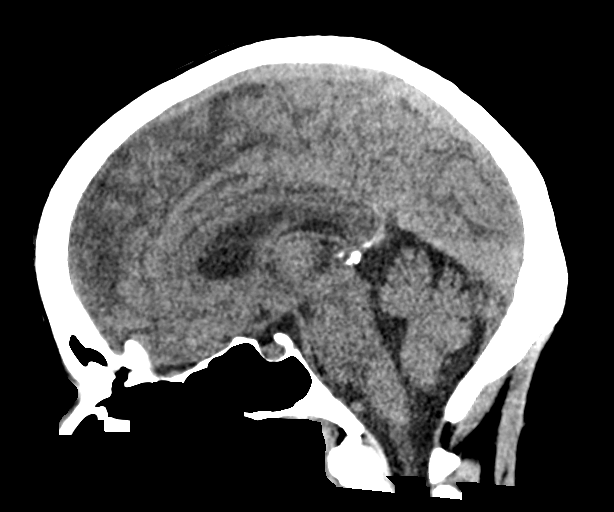
[im 37/55  brain]
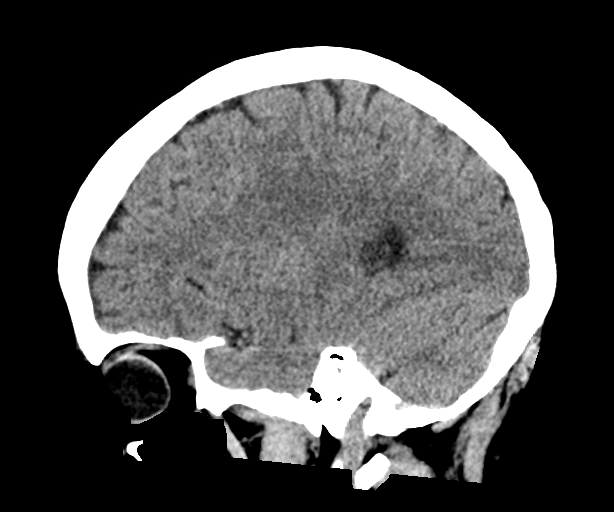

[16 of 47 positions shown; findings below may reference images not displayed]

FINDINGS: Brain: No evidence of acute infarction, hemorrhage, hydrocephalus,
extra-axial collection or mass lesion/mass effect.

Vascular: No hyperdense vessel or unexpected calcification.

Skull: Normal. Negative for fracture or focal lesion.

Sinuses/Orbits: Globes orbits are unremarkable. Visualized sinuses
and mastoid air cells are clear.

Other: Nondisplaced fracture of the left nasal bone.
IMPRESSION: 1. No intracranial abnormality.  No skull fracture.
2. Nondisplaced fracture of the left nasal bone.

## 2019-12-08 ENCOUNTER — Other Ambulatory Visit: Payer: Self-pay

## 2019-12-08 DIAGNOSIS — Z20822 Contact with and (suspected) exposure to covid-19: Secondary | ICD-10-CM

## 2019-12-10 DIAGNOSIS — R059 Cough, unspecified: Secondary | ICD-10-CM | POA: Diagnosis not present

## 2019-12-10 LAB — SARS-COV-2, NAA 2 DAY TAT

## 2019-12-10 LAB — NOVEL CORONAVIRUS, NAA: SARS-CoV-2, NAA: NOT DETECTED

## 2020-02-24 DIAGNOSIS — K219 Gastro-esophageal reflux disease without esophagitis: Secondary | ICD-10-CM | POA: Diagnosis not present

## 2020-02-24 DIAGNOSIS — M1990 Unspecified osteoarthritis, unspecified site: Secondary | ICD-10-CM | POA: Diagnosis not present

## 2020-02-24 DIAGNOSIS — F3341 Major depressive disorder, recurrent, in partial remission: Secondary | ICD-10-CM | POA: Diagnosis not present

## 2020-02-24 DIAGNOSIS — G4700 Insomnia, unspecified: Secondary | ICD-10-CM | POA: Diagnosis not present

## 2020-04-26 DIAGNOSIS — R030 Elevated blood-pressure reading, without diagnosis of hypertension: Secondary | ICD-10-CM | POA: Diagnosis not present

## 2020-04-26 DIAGNOSIS — E7800 Pure hypercholesterolemia, unspecified: Secondary | ICD-10-CM | POA: Diagnosis not present

## 2020-04-26 DIAGNOSIS — F419 Anxiety disorder, unspecified: Secondary | ICD-10-CM | POA: Diagnosis not present

## 2020-04-26 DIAGNOSIS — G4700 Insomnia, unspecified: Secondary | ICD-10-CM | POA: Diagnosis not present

## 2020-04-26 DIAGNOSIS — F3341 Major depressive disorder, recurrent, in partial remission: Secondary | ICD-10-CM | POA: Diagnosis not present

## 2020-05-14 DIAGNOSIS — Z23 Encounter for immunization: Secondary | ICD-10-CM | POA: Diagnosis not present

## 2020-06-10 DIAGNOSIS — Z1151 Encounter for screening for human papillomavirus (HPV): Secondary | ICD-10-CM | POA: Diagnosis not present

## 2020-06-10 DIAGNOSIS — Z01419 Encounter for gynecological examination (general) (routine) without abnormal findings: Secondary | ICD-10-CM | POA: Diagnosis not present

## 2020-06-10 DIAGNOSIS — Z90711 Acquired absence of uterus with remaining cervical stump: Secondary | ICD-10-CM | POA: Diagnosis not present

## 2020-06-10 DIAGNOSIS — Z124 Encounter for screening for malignant neoplasm of cervix: Secondary | ICD-10-CM | POA: Diagnosis not present

## 2020-07-15 DIAGNOSIS — K5904 Chronic idiopathic constipation: Secondary | ICD-10-CM | POA: Diagnosis not present

## 2020-07-15 DIAGNOSIS — K219 Gastro-esophageal reflux disease without esophagitis: Secondary | ICD-10-CM | POA: Diagnosis not present

## 2020-07-20 DIAGNOSIS — E039 Hypothyroidism, unspecified: Secondary | ICD-10-CM | POA: Diagnosis not present

## 2020-07-20 DIAGNOSIS — R5382 Chronic fatigue, unspecified: Secondary | ICD-10-CM | POA: Diagnosis not present

## 2020-07-20 DIAGNOSIS — R635 Abnormal weight gain: Secondary | ICD-10-CM | POA: Diagnosis not present

## 2020-07-20 DIAGNOSIS — N951 Menopausal and female climacteric states: Secondary | ICD-10-CM | POA: Diagnosis not present

## 2020-07-27 DIAGNOSIS — Z1339 Encounter for screening examination for other mental health and behavioral disorders: Secondary | ICD-10-CM | POA: Diagnosis not present

## 2020-07-27 DIAGNOSIS — R5382 Chronic fatigue, unspecified: Secondary | ICD-10-CM | POA: Diagnosis not present

## 2020-07-27 DIAGNOSIS — E039 Hypothyroidism, unspecified: Secondary | ICD-10-CM | POA: Diagnosis not present

## 2020-07-27 DIAGNOSIS — F3341 Major depressive disorder, recurrent, in partial remission: Secondary | ICD-10-CM | POA: Diagnosis not present

## 2020-07-27 DIAGNOSIS — R232 Flushing: Secondary | ICD-10-CM | POA: Diagnosis not present

## 2020-07-27 DIAGNOSIS — Z1331 Encounter for screening for depression: Secondary | ICD-10-CM | POA: Diagnosis not present

## 2020-08-24 DIAGNOSIS — E039 Hypothyroidism, unspecified: Secondary | ICD-10-CM | POA: Diagnosis not present

## 2020-08-24 DIAGNOSIS — Z6830 Body mass index (BMI) 30.0-30.9, adult: Secondary | ICD-10-CM | POA: Diagnosis not present

## 2020-08-31 DIAGNOSIS — E039 Hypothyroidism, unspecified: Secondary | ICD-10-CM | POA: Diagnosis not present

## 2020-08-31 DIAGNOSIS — Z6830 Body mass index (BMI) 30.0-30.9, adult: Secondary | ICD-10-CM | POA: Diagnosis not present

## 2020-09-08 DIAGNOSIS — M79605 Pain in left leg: Secondary | ICD-10-CM | POA: Diagnosis not present

## 2020-09-08 DIAGNOSIS — S8265XA Nondisplaced fracture of lateral malleolus of left fibula, initial encounter for closed fracture: Secondary | ICD-10-CM | POA: Diagnosis not present

## 2020-09-16 DIAGNOSIS — E039 Hypothyroidism, unspecified: Secondary | ICD-10-CM | POA: Diagnosis not present

## 2020-09-16 DIAGNOSIS — N951 Menopausal and female climacteric states: Secondary | ICD-10-CM | POA: Diagnosis not present

## 2020-09-16 DIAGNOSIS — R5382 Chronic fatigue, unspecified: Secondary | ICD-10-CM | POA: Diagnosis not present

## 2020-09-16 DIAGNOSIS — Z6830 Body mass index (BMI) 30.0-30.9, adult: Secondary | ICD-10-CM | POA: Diagnosis not present

## 2020-09-29 DIAGNOSIS — S8265XA Nondisplaced fracture of lateral malleolus of left fibula, initial encounter for closed fracture: Secondary | ICD-10-CM | POA: Diagnosis not present

## 2020-10-03 NOTE — Progress Notes (Signed)
Cardiology Office Note:   Date:  10/05/2020  NAME:  Erica Rowland    MRN: [id number] DOB:  23-Jun-1960   PCP:  Erica Joe, MD  Cardiologist:  None  Electrophysiologist:  None   Referring MD: Erica Joe, MD   Chief Complaint  Patient presents with   Chest Pain    History of Present Illness:   Erica Rowland is a 60 y.o. female with a hx of RBBB who is being seen today for the evaluation of RBBB at the request of Erica Joe, MD. she reports she has followed with cardiology in the past.  She was told she had mitral valve prolapse and a right bundle branch block.  Echocardiogram at [facility] several years ago was normal.  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  She has recovered from this.  She now has a fracture in her left foot.  She reports for the past 1 year she had intermittent episodes of chest pain.  Described as pressure as well as tightness in her chest.  She reports it can occur 1 time per week.  Can occur at any time without identifiable triggers or alleviating factors.  There is associated nausea.  Family history is significant for heart disease in her mother as well as maternal grandparents.  She has never had a heart attack or stroke.  She is a never smoker.  She does not drink alcohol or use drugs.  She works as a Librarian, academic.  Her medical history is significant for hypothyroidism.  No history of hypertension.  Most recent LDL cholesterol 174.  She did not tolerate Crestor.  She has exceedingly high HDL cholesterol.  She also reports exertional shortness of breath.  No exertional chest pain.  She had a stress test a number of years ago during that was normal.  She has not had reevaluation.  Medical history is really significant for depression and anxiety.  She reports her symptoms may be anxiety related.  However she would like to know that her heart is okay.  No murmurs on examination.  EKG demonstrates right bundle branch  block.  Problem List RBBB T chol 276, HDL 93, LDL 174, 63   Past Medical History: Past Medical History:  Diagnosis Date   Hyperlipidemia    Murmur    RBBB     Past Surgical History: Past Surgical History:  Procedure Laterality Date   ABDOMINAL HYSTERECTOMY     APPENDECTOMY     PARTIAL HYSTERECTOMY     revision of left total hip     SPINAL FUSION     TOTAL HIP ARTHROPLASTY Left     Current Medications: Current Meds  Medication Sig   acyclovir (ZOVIRAX) 400 MG tablet Take 2 tablets (800 mg total) by mouth 5 (five) times daily.   buPROPion (WELLBUTRIN XL) 300 MG 24 hr tablet Take 300 mg by mouth daily.   celecoxib (CELEBREX) 200 MG capsule Take 200 mg by mouth daily.   Cholecalciferol (VITAMIN D3 PO) Take 1 tablet by mouth daily.   clonazePAM (KLONOPIN) 0.5 MG tablet Take 0.5 mg by mouth as needed for anxiety.   escitalopram (LEXAPRO) 10 MG tablet Take 10 mg by mouth daily.   metoprolol tartrate (LOPRESSOR) 100 MG tablet TAKE 2 HOURS PRIOR TO CT SCAN   valACYclovir (VALTREX) 1000 MG tablet Take 1,000 mg by mouth as needed.   vitamin B-12 (CYANOCOBALAMIN) 500 MCG tablet Take 500 mcg by mouth daily.  Allergies:    Rifampin   Social History: Social History   Socioeconomic History   Marital status: Married    Spouse name: Not on file   Number of children: 0   Years of education: Not on file   Highest education level: Not on file  Occupational History   Occupation: Airline pilotsales   Occupation: Librarian, academicLegal Assistant  Tobacco Use   Smoking status: Never   Smokeless tobacco: Never  Substance and Sexual Activity   Alcohol use: Yes    Alcohol/week: 2.0 standard drinks    Types: 2 Glasses of wine per week    Comment: weekly    Drug use: No   Sexual activity: Not on file  Other Topics Concern   Not on file  Social History Narrative   Not on file   Social Determinants of Health   Financial Resource Strain: Not on file  Food Insecurity: Not on file  Transportation Needs:  Not on file  Physical Activity: Not on file  Stress: Not on file  Social Connections: Not on file     Family History: The patient's family history includes Cancer in her father; Dementia in her mother; Heart attack in her maternal grandfather, maternal grandmother, and mother.  ROS:   All other ROS reviewed and negative. Pertinent positives noted in the HPI.     EKGs/Labs/Other Studies Reviewed:   The following studies were personally reviewed by me today:  EKG:  EKG is ordered today.  The ekg ordered today demonstrates normal sinus rhythm heart rate 74, right bundle branch block, and was personally reviewed by me.   Recent Labs: No results found for requested labs within last 8760 hours.   Recent Lipid Panel No results found for: CHOL, TRIG, HDL, CHOLHDL, VLDL, LDLCALC, LDLDIRECT  Physical Exam:   VS:  BP 122/78   Pulse 74   Resp 20   Ht 5\' 4"  (1.626 m)   Wt 176 lb 3.2 oz (79.9 kg)   SpO2 99%   BMI 30.24 kg/m    Wt Readings from Last 3 Encounters:  10/05/20 176 lb 3.2 oz (79.9 kg)  04/06/17 167 lb 5.3 oz (75.9 kg)  12/28/16 168 lb (76.2 kg)    General: Well nourished, well developed, in no acute distress Head: Atraumatic, normal size  Eyes: PEERLA, EOMI  Neck: Supple, no JVD Endocrine: No thryomegaly Cardiac: Normal S1, S2; RRR; no murmurs, rubs, or gallops Lungs: Clear to auscultation bilaterally, no wheezing, rhonchi or rales  Abd: Soft, nontender, no hepatomegaly  Ext: No edema, pulses 2+ Musculoskeletal: No deformities, BUE and BLE strength normal and equal Skin: Warm and dry, no rashes   Neuro: Alert and oriented to person, place, time, and situation, CNII-XII grossly intact, no focal deficits  Psych: Normal mood and affect   ASSESSMENT:   Erica Rowland is a 60 y.o. female who presents for the following: 1. Chest pain of uncertain etiology   2. RBBB   3. Mitral valve prolapse   4. Mixed hyperlipidemia     PLAN:   1. Chest pain of uncertain  etiology -Atypical chest pain.  Symptoms could be acid reflux related versus anxiety.  CVD risk factors include strong family history as well as elevated LDL cholesterol.  Would recommend to proceed with coronary CTA.  She will take 100 mg metoprolol tartrate 2 hours before the scan she will give us a BMP today as well. -No recent thyroid we will check a TSH. -She will hold on lipid-lowering agents  until we know the results of her CT scan.  Given her high HDL cholesterol she may not need a statin.  We will see what her scan shows.  2. RBBB -Chronic.  Repeat echocardiogram.  3. Mitral valve prolapse -History of mitral valve prolapse.  No murmur on examination.  She is reporting shortness of breath.  We will repeat an echocardiogram.  I wonder if she was diagnosed incorrectly in the past.  I did counsel her that mitral valve prolapse was over diagnosed in the past.  4. Mixed hyperlipidemia -LDL 174.  No other real CVD risk factors other than family history.  Did not tolerate Crestor 10 mg daily.  We will decide on statin agent based on the results of her coronary CTA.   Disposition: Return if symptoms worsen or fail to improve.  Medication Adjustments/Labs and Tests Ordered: Current medicines are reviewed at length with the patient today.  Concerns regarding medicines are outlined above.  Orders Placed This Encounter  Procedures   CT CORONARY MORPH W/CTA COR W/SCORE W/CA W/CM &/OR WO/CM   TSH   Basic Metabolic Panel (BMET)   EKG 12-Lead   ECHOCARDIOGRAM COMPLETE    Meds ordered this encounter  Medications   metoprolol tartrate (LOPRESSOR) 100 MG tablet    Sig: TAKE 2 HOURS PRIOR TO CT SCAN    Dispense:  1 tablet    Refill:  0     Patient Instructions   Lab Work:  Your physician recommends that you HAVE LAB WORK TODAY  If you have labs (blood work) drawn today and your tests are completely normal, you will receive your results only by: MyChart Message (if you have MyChart)  OR A paper copy in the mail If you have any lab test that is abnormal or we need to change your treatment, we will call you to review the results.   Testing/Procedures:  Your physician has requested that you have an echocardiogram. Echocardiography is a painless test that uses sound waves to create images of your heart. It provides your doctor with information about the size and shape of your heart and how well your heart's chambers and valves are working. This procedure takes approximately one hour. There are no restrictions for this procedure. [address]     Your cardiac CT will be scheduled at:  [facility] [address] [place], [place] [address] [phone]  If scheduled at [facility], please arrive at the [facility] main entrance (entrance A) of [facility] 30 minutes prior to test start time. Proceed to the [facility] Radiology Department (first floor) to check-in and test prep.  Please follow these instructions carefully (unless otherwise directed):   On the Night Before the Test: Be sure to Drink plenty of water. Do not consume any caffeinated/decaffeinated beverages or chocolate 12 hours prior to your test. Do not take any antihistamines 12 hours prior to your test.   On the Day of the Test: Drink plenty of water until 1 hour prior to the test. Do not eat any food 4 hours prior to the test. You may take your regular medications prior to the test.  Take metoprolol (Lopressor) 100 MG two hours prior to test. HOLD Furosemide/Hydrochlorothiazide morning of the test. FEMALES- please wear underwire-free bra if available, avoid dresses & tight clothing      After the Test: Drink plenty of water. After receiving IV contrast, you may experience a mild flushed feeling. This is normal.  On occasion, you may experience a mild rash up to 24 hours after the test. This is not dangerous. If this occurs, you can take Benadryl 25 mg  and increase your fluid intake. If you experience trouble breathing, this can be serious. If it is severe call 911 IMMEDIATELY. If it is mild, please call our office. If you take any of these medications: Glipizide/Metformin, Avandament, Glucavance, please do not take 48 hours after completing test unless otherwise instructed.  Please allow 2-4 weeks for scheduling of routine cardiac CTs. Some insurance companies require a pre-authorization which may delay scheduling of this test.   For non-scheduling related questions, please contact the cardiac imaging nurse navigator should you have any questions/concerns: Rockwell Alexandria, Cardiac Imaging Nurse Navigator Larey Brick, Cardiac Imaging Nurse Navigator [facility] Direct Office Dial: [phone]   For scheduling needs, including cancellations and rescheduling, please call [place], [phone].    Follow-Up: At [facility], you and your health needs are our priority.  As part of our continuing mission to provide you with exceptional heart care, we have created designated Provider Care Teams.  These Care Teams include your primary Cardiologist (physician) and Advanced Practice Providers (APPs -  Physician Assistants and Nurse Practitioners) who all work together to provide you with the care you need, when you need it.  We recommend signing up for the patient portal called "MyChart".  Sign up information is provided on this After Visit Summary.  MyChart is used to connect with patients for Virtual Visits (Telemedicine).  Patients are able to view lab/test results, encounter notes, upcoming appointments, etc.  Non-urgent messages can be sent to your provider as well.   To learn more about what you can do with MyChart, go to [email or web address].    Your next appointment:    AS NEEDED    Signed, Gerri Spore T. Flora Lipps, MD, [facility]  [facility]  [address], [address] [place], [place]  [address] [phone]  10/05/2020 9:20 AM

## 2020-10-05 ENCOUNTER — Ambulatory Visit (INDEPENDENT_AMBULATORY_CARE_PROVIDER_SITE_OTHER): Payer: BC Managed Care – PPO | Admitting: Cardiovascular Disease

## 2020-10-05 ENCOUNTER — Other Ambulatory Visit: Payer: Self-pay

## 2020-10-05 ENCOUNTER — Encounter: Payer: Self-pay | Admitting: Cardiovascular Disease

## 2020-10-05 VITALS — BP 122/78 | HR 74 | Resp 20 | Ht 64.0 in | Wt 176.2 lb

## 2020-10-05 DIAGNOSIS — I341 Nonrheumatic mitral (valve) prolapse: Secondary | ICD-10-CM | POA: Diagnosis not present

## 2020-10-05 DIAGNOSIS — I4510 Unspecified right bundle-branch block: Secondary | ICD-10-CM

## 2020-10-05 DIAGNOSIS — R079 Chest pain, unspecified: Secondary | ICD-10-CM

## 2020-10-05 DIAGNOSIS — E782 Mixed hyperlipidemia: Secondary | ICD-10-CM

## 2020-10-05 LAB — BASIC METABOLIC PANEL
BUN/Creatinine Ratio: 16 (ref 12–28)
BUN: 15 mg/dL (ref 8–27)
CO2: 23 mmol/L (ref 20–29)
Calcium: 10.6 mg/dL — ABNORMAL HIGH (ref 8.7–10.3)
Chloride: 103 mmol/L (ref 96–106)
Creatinine, Ser: 0.94 mg/dL (ref 0.57–1.00)
Glucose: 90 mg/dL (ref 65–99)
Potassium: 5.6 mmol/L — ABNORMAL HIGH (ref 3.5–5.2)
Sodium: 143 mmol/L (ref 134–144)
eGFR: 69 mL/min/{1.73_m2} (ref >59)

## 2020-10-05 LAB — TSH: TSH: 6.36 u[IU]/mL — ABNORMAL HIGH (ref 0.450–4.500)

## 2020-10-05 MED ORDER — METOPROLOL TARTRATE 100 MG PO TABS: ORAL_TABLET | ORAL | 0 refills | Status: DC

## 2020-10-05 NOTE — Patient Instructions (Signed)
Lab Work:  Your physician recommends that you HAVE LAB WORK TODAY  If you have labs (blood work) drawn today and your tests are completely normal, you will receive your results only by: MyChart Message (if you have MyChart) OR A paper copy in the mail If you have any lab test that is abnormal or we need to change your treatment, we will call you to review the results.   Testing/Procedures:  Your physician has requested that you have an echocardiogram. Echocardiography is a painless test that uses sound waves to create images of your heart. It provides your doctor with information about the size and shape of your heart and how well your heart's chambers and valves are working. This procedure takes approximately one hour. There are no restrictions for this procedure. [address]     Your cardiac CT will be scheduled at:  [facility] [address] [place], [place] [address] [phone]  If scheduled at [facility], please arrive at the [facility] main entrance (entrance A) of [facility] 30 minutes prior to test start time. Proceed to the [facility] Radiology Department (first floor) to check-in and test prep.  Please follow these instructions carefully (unless otherwise directed):   On the Night Before the Test: Be sure to Drink plenty of water. Do not consume any caffeinated/decaffeinated beverages or chocolate 12 hours prior to your test. Do not take any antihistamines 12 hours prior to your test.   On the Day of the Test: Drink plenty of water until 1 hour prior to the test. Do not eat any food 4 hours prior to the test. You may take your regular medications prior to the test.  Take metoprolol (Lopressor) 100 MG two hours prior to test. HOLD Furosemide/Hydrochlorothiazide morning of the test. FEMALES- please wear underwire-free bra if available, avoid dresses & tight clothing      After the Test: Drink plenty of  water. After receiving IV contrast, you may experience a mild flushed feeling. This is normal. On occasion, you may experience a mild rash up to 24 hours after the test. This is not dangerous. If this occurs, you can take Benadryl 25 mg and increase your fluid intake. If you experience trouble breathing, this can be serious. If it is severe call 911 IMMEDIATELY. If it is mild, please call our office. If you take any of these medications: Glipizide/Metformin, Avandament, Glucavance, please do not take 48 hours after completing test unless otherwise instructed.  Please allow 2-4 weeks for scheduling of routine cardiac CTs. Some insurance companies require a pre-authorization which may delay scheduling of this test.   For non-scheduling related questions, please contact the cardiac imaging nurse navigator should you have any questions/concerns: Rockwell Alexandria, Cardiac Imaging Nurse Navigator Larey Brick, Cardiac Imaging Nurse Navigator [facility] Direct Office Dial: [phone]   For scheduling needs, including cancellations and rescheduling, please call [place], [phone].    Follow-Up: At [facility], you and your health needs are our priority.  As part of our continuing mission to provide you with exceptional heart care, we have created designated Provider Care Teams.  These Care Teams include your primary Cardiologist (physician) and Advanced Practice Providers (APPs -  Physician Assistants and Nurse Practitioners) who all work together to provide you with the care you need, when you need it.  We recommend signing up for the patient portal called "MyChart".  Sign up information is provided on this After  Visit Summary.  MyChart is used to connect with patients for Virtual Visits (Telemedicine).  Patients are able to view lab/test results, encounter notes, upcoming appointments, etc.  Non-urgent messages can be sent to your provider as well.   To learn more  about what you can do with MyChart, go to [email or web address].    Your next appointment:    AS NEEDED

## 2020-10-06 ENCOUNTER — Other Ambulatory Visit: Payer: Self-pay | Admitting: *Deleted

## 2020-10-06 DIAGNOSIS — R7989 Other specified abnormal findings of blood chemistry: Secondary | ICD-10-CM

## 2020-10-13 ENCOUNTER — Telehealth (HOSPITAL_COMMUNITY): Payer: Self-pay | Admitting: Emergency Medicine

## 2020-10-13 NOTE — Telephone Encounter (Signed)
Reaching out to patient to offer assistance regarding upcoming cardiac imaging study; pt verbalizes understanding of appt date/time, parking situation and where to check in, pre-test NPO status and medications ordered, and verified current allergies; name and call back number provided for further questions should they arise Rockwell Alexandria RN Navigator Cardiac Imaging [facility] [phone] office [phone] cell  Sometimes difficult IV Denies claustro 100mg  metoprolol tart

## 2020-10-14 ENCOUNTER — Other Ambulatory Visit: Payer: Self-pay

## 2020-10-14 ENCOUNTER — Encounter (HOSPITAL_COMMUNITY): Payer: Self-pay

## 2020-10-14 ENCOUNTER — Ambulatory Visit (HOSPITAL_COMMUNITY)
Admission: RE | Admit: 2020-10-14 | Discharge: 2020-10-14 | Disposition: A | Discharge status: Home / Self Care | Payer: BC Managed Care – PPO | Source: Ambulatory Visit | Attending: Cardiovascular Disease | Admitting: Cardiovascular Disease

## 2020-10-14 DIAGNOSIS — R079 Chest pain, unspecified: Secondary | ICD-10-CM | POA: Diagnosis not present

## 2020-10-14 DIAGNOSIS — S8265XA Nondisplaced fracture of lateral malleolus of left fibula, initial encounter for closed fracture: Secondary | ICD-10-CM | POA: Diagnosis not present

## 2020-10-14 DIAGNOSIS — R7989 Other specified abnormal findings of blood chemistry: Secondary | ICD-10-CM | POA: Diagnosis not present

## 2020-10-14 MED ORDER — IOHEXOL 350 MG/ML SOLN
95.0000 mL | Freq: Once | INTRAVENOUS | Status: AC | PRN
  Administered 2020-10-14: 95 mL via INTRAVENOUS

## 2020-10-14 MED ORDER — NITROGLYCERIN 0.4 MG SL SUBL
SUBLINGUAL_TABLET | SUBLINGUAL | Status: AC
  Filled 2020-10-14: qty 2

## 2020-10-14 MED ORDER — NITROGLYCERIN 0.4 MG SL SUBL
0.8000 mg | SUBLINGUAL_TABLET | Freq: Once | SUBLINGUAL | Status: AC
  Administered 2020-10-14: 0.8 mg via SUBLINGUAL

## 2020-10-15 LAB — BASIC METABOLIC PANEL
BUN/Creatinine Ratio: 20 (ref 12–28)
BUN: 18 mg/dL (ref 8–27)
CO2: 24 mmol/L (ref 20–29)
Calcium: 10.5 mg/dL — ABNORMAL HIGH (ref 8.7–10.3)
Chloride: 102 mmol/L (ref 96–106)
Creatinine, Ser: 0.91 mg/dL (ref 0.57–1.00)
Glucose: 80 mg/dL (ref 65–99)
Potassium: 5 mmol/L (ref 3.5–5.2)
Sodium: 142 mmol/L (ref 134–144)
eGFR: 72 mL/min/{1.73_m2} (ref >59)

## 2020-10-15 LAB — T3, FREE: T3, Free: 2.6 pg/mL (ref 2.0–4.4)

## 2020-10-15 LAB — T4, FREE: Free T4: 0.95 ng/dL (ref 0.82–1.77)

## 2020-10-26 ENCOUNTER — Ambulatory Visit (HOSPITAL_COMMUNITY): Payer: BC Managed Care – PPO | Attending: Cardiology

## 2020-10-26 ENCOUNTER — Encounter (HOSPITAL_COMMUNITY): Payer: Self-pay | Admitting: Cardiovascular Disease

## 2020-11-09 ENCOUNTER — Telehealth (HOSPITAL_COMMUNITY): Payer: Self-pay | Admitting: Cardiovascular Disease

## 2020-11-09 NOTE — Telephone Encounter (Signed)
Just an FYI. We have made several attempts to contact this patient including sending a letter to schedule or reschedule their echocardiogram. We will be removing the patient from the echo WQ.   10/26/20 NO SHOWED-MAILED LETTER [facility]     Thank you

## 2020-11-11 DIAGNOSIS — N951 Menopausal and female climacteric states: Secondary | ICD-10-CM | POA: Diagnosis not present

## 2020-11-11 DIAGNOSIS — E039 Hypothyroidism, unspecified: Secondary | ICD-10-CM | POA: Diagnosis not present

## 2020-11-25 DIAGNOSIS — S8265XA Nondisplaced fracture of lateral malleolus of left fibula, initial encounter for closed fracture: Secondary | ICD-10-CM | POA: Diagnosis not present

## 2020-12-02 DIAGNOSIS — R5382 Chronic fatigue, unspecified: Secondary | ICD-10-CM | POA: Diagnosis not present

## 2020-12-02 DIAGNOSIS — R232 Flushing: Secondary | ICD-10-CM | POA: Diagnosis not present

## 2020-12-02 DIAGNOSIS — N951 Menopausal and female climacteric states: Secondary | ICD-10-CM | POA: Diagnosis not present

## 2020-12-02 DIAGNOSIS — Z6830 Body mass index (BMI) 30.0-30.9, adult: Secondary | ICD-10-CM | POA: Diagnosis not present

## 2020-12-06 DIAGNOSIS — M25572 Pain in left ankle and joints of left foot: Secondary | ICD-10-CM | POA: Diagnosis not present

## 2020-12-07 ENCOUNTER — Ambulatory Visit (HOSPITAL_COMMUNITY): Payer: BC Managed Care – PPO

## 2020-12-09 ENCOUNTER — Other Ambulatory Visit: Payer: Self-pay

## 2020-12-09 ENCOUNTER — Ambulatory Visit (HOSPITAL_COMMUNITY): Payer: BC Managed Care – PPO | Attending: Cardiology

## 2020-12-09 DIAGNOSIS — I341 Nonrheumatic mitral (valve) prolapse: Secondary | ICD-10-CM | POA: Diagnosis not present

## 2020-12-09 LAB — ECHOCARDIOGRAM COMPLETE
Area-P 1/2: 3.48 cm2
P 1/2 time: 273 msec
S' Lateral: 2.8 cm

## 2020-12-16 DIAGNOSIS — E7800 Pure hypercholesterolemia, unspecified: Secondary | ICD-10-CM | POA: Diagnosis not present

## 2020-12-16 DIAGNOSIS — F419 Anxiety disorder, unspecified: Secondary | ICD-10-CM | POA: Diagnosis not present

## 2020-12-16 DIAGNOSIS — G4700 Insomnia, unspecified: Secondary | ICD-10-CM | POA: Diagnosis not present

## 2020-12-16 DIAGNOSIS — F3341 Major depressive disorder, recurrent, in partial remission: Secondary | ICD-10-CM | POA: Diagnosis not present

## 2020-12-16 DIAGNOSIS — Z8639 Personal history of other endocrine, nutritional and metabolic disease: Secondary | ICD-10-CM | POA: Diagnosis not present

## 2020-12-29 DIAGNOSIS — E875 Hyperkalemia: Secondary | ICD-10-CM | POA: Diagnosis not present

## 2021-01-06 DIAGNOSIS — M542 Cervicalgia: Secondary | ICD-10-CM | POA: Diagnosis not present

## 2021-01-06 DIAGNOSIS — S82892A Other fracture of left lower leg, initial encounter for closed fracture: Secondary | ICD-10-CM | POA: Diagnosis not present

## 2021-02-15 DIAGNOSIS — Z1231 Encounter for screening mammogram for malignant neoplasm of breast: Secondary | ICD-10-CM | POA: Diagnosis not present

## 2021-03-09 DIAGNOSIS — M545 Low back pain, unspecified: Secondary | ICD-10-CM | POA: Diagnosis not present

## 2021-03-09 DIAGNOSIS — G5603 Carpal tunnel syndrome, bilateral upper limbs: Secondary | ICD-10-CM | POA: Diagnosis not present

## 2021-03-31 DIAGNOSIS — M542 Cervicalgia: Secondary | ICD-10-CM | POA: Diagnosis not present

## 2021-03-31 DIAGNOSIS — M79601 Pain in right arm: Secondary | ICD-10-CM | POA: Diagnosis not present

## 2021-03-31 DIAGNOSIS — G5603 Carpal tunnel syndrome, bilateral upper limbs: Secondary | ICD-10-CM | POA: Diagnosis not present

## 2021-04-05 DIAGNOSIS — R200 Anesthesia of skin: Secondary | ICD-10-CM | POA: Diagnosis not present

## 2021-04-06 DIAGNOSIS — M5412 Radiculopathy, cervical region: Secondary | ICD-10-CM | POA: Diagnosis not present

## 2021-04-12 DIAGNOSIS — M4692 Unspecified inflammatory spondylopathy, cervical region: Secondary | ICD-10-CM | POA: Diagnosis not present

## 2021-04-15 DIAGNOSIS — M542 Cervicalgia: Secondary | ICD-10-CM | POA: Diagnosis not present

## 2021-04-19 DIAGNOSIS — M542 Cervicalgia: Secondary | ICD-10-CM | POA: Diagnosis not present

## 2021-04-19 DIAGNOSIS — M4722 Other spondylosis with radiculopathy, cervical region: Secondary | ICD-10-CM | POA: Diagnosis not present

## 2021-04-26 DIAGNOSIS — G959 Disease of spinal cord, unspecified: Secondary | ICD-10-CM | POA: Diagnosis not present

## 2021-05-03 ENCOUNTER — Other Ambulatory Visit: Payer: Self-pay | Admitting: Orthopedic Surgery

## 2021-05-04 ENCOUNTER — Telehealth: Payer: Self-pay

## 2021-05-04 NOTE — Telephone Encounter (Signed)
? ?  Pre-operative Risk Assessment  ?  ?Patient Name: Erica Rowland  ?DOB: 1960/05/05 ?MRN: [id number]  ? ?  ? ?Request for Surgical Clearance   ? ?Procedure:  ANTERIOR CERVICAL DECOMPRESSION FUSION CERVICAL 3- CERVICAL 4 WITH INSTRUMENTATION AND ALLOGRAFT ? ?Date of Surgery:  Clearance 05/19/21                              ?   ?Surgeon:  Dr. Estill Bamberg, MD ?Surgeon's Group or Practice Name:  [facility]  ?Phone number:  [phone] ?Fax number:  [phone] ?  ?Type of Clearance Requested:   ?- Medical  ?  ?Type of Anesthesia:  Not Indicated ?  ?Additional requests/questions:   ? ?Signed, ?Parke Poisson   ?05/04/2021, 5:16 PM   ?

## 2021-05-05 NOTE — Telephone Encounter (Signed)
Primary Cardiologist:Gas Cleophus Molt, MD ? ?Chart reviewed as part of pre-operative protocol coverage. Because of Erica Rowland's past medical history and time since last visit, he/she will require a follow-up visit in order to better assess preoperative cardiovascular risk. ? ?Pre-op covering staff: ?- Please schedule appointment and call patient to inform them. ?- Please contact requesting surgeon's office via preferred method (i.e, phone, fax) to inform them of need for appointment prior to surgery. ? ?If applicable, this message will also be routed to pharmacy pool and/or primary cardiologist for input on holding anticoagulant/antiplatelet agent as requested below so that this information is available at time of patient's appointment.  ? ?Erica Asters, NP  ?05/05/2021, 9:51 AM  ? ?

## 2021-05-05 NOTE — Telephone Encounter (Signed)
I s/w the pt and she is agreeable to plan of care for pre op appt. Pt has been scheduled to see Laurann Montana, NP 05/18/21 @ 8:50 at [facility] location. Pt has bee given address for [facility]. Pt is grateful for the help and the call. I will send notes to NP for upcoming appt. Will send FYI to Dr. Lynann Bologna pt has appt 05/18/21, though we will continue to try and get her in sooner.  ?

## 2021-05-06 NOTE — Telephone Encounter (Signed)
Thank you! Just FYI Dr. Yevette Edwards patients preop appt has been moved up to 05/10/21.  ? ?Alver Sorrow, NP  ?

## 2021-05-06 NOTE — Telephone Encounter (Signed)
Call placed to pt and she has accepted the sooner appt, 05/10/21.  Clearance will be addressed at that time.  Will let the requesting surgeon's office know as well.  ?

## 2021-05-10 ENCOUNTER — Encounter (HOSPITAL_BASED_OUTPATIENT_CLINIC_OR_DEPARTMENT_OTHER): Payer: Self-pay | Admitting: Family

## 2021-05-10 ENCOUNTER — Ambulatory Visit (INDEPENDENT_AMBULATORY_CARE_PROVIDER_SITE_OTHER): Payer: BC Managed Care – PPO | Admitting: Family

## 2021-05-10 ENCOUNTER — Other Ambulatory Visit: Payer: Self-pay

## 2021-05-10 VITALS — BP 142/90 | HR 70 | Ht 64.0 in | Wt 187.0 lb

## 2021-05-10 DIAGNOSIS — I4510 Unspecified right bundle-branch block: Secondary | ICD-10-CM

## 2021-05-10 DIAGNOSIS — Z01818 Encounter for other preprocedural examination: Secondary | ICD-10-CM | POA: Diagnosis not present

## 2021-05-10 NOTE — Progress Notes (Signed)
? ?Office Visit  ?  ?Patient Name: Erica Rowland ?Date of Encounter: 05/10/2021 ? ?PCP:  Tally Joe, MD ?  ?[facility]  ?Cardiologist:  Reatha Harps, MD  ?Advanced Practice Provider:  No care team member to display ?Electrophysiologist:  None  ?   ? ?Chief Complaint  ?  ?Erica Rowland is a 61 y.o. female with a hx of RBBB, atypical chest pain with coronary calcium score of 0, depression  presents today for preop clearance  ? ?Past Medical History  ?  ?Past Medical History:  ?Diagnosis Date  ? Hyperlipidemia   ? Murmur   ? RBBB   ? ?Past Surgical History:  ?Procedure Laterality Date  ? ABDOMINAL HYSTERECTOMY    ? APPENDECTOMY    ? PARTIAL HYSTERECTOMY    ? revision of left total hip    ? SPINAL FUSION    ? TOTAL HIP ARTHROPLASTY Left   ? ? ?Allergies ? ?Allergies  ?Allergen Reactions  ? Rifampin Itching  ?  Hands and feet were swelling and itching.  ? ? ?History of Present Illness  ?  ?Erica Rowland is a 61 y.o. female with a hx of RBBB, atypical chest pain with coronary calcium score of 0, depression last seen 10/05/20 by Dr. Flora Lipps. ? ?Prior cardiac workup for exertional dyspnea and atypical chest pain. Coronary artery calcium score 2022 of 0. Echo 12/2020 normal LVEF 60-65%, no RWMA, no significant valvular abnormalities.  ? ?Present today for preop clearance for anterior cervical decompression fusion cervical 3-cervical 4 with instrumentation and allograft on 05/19/21. Reports no chest pain, pressure, or tightness. No edema, orthopnea, PND. Reports no palpitations.  Does note exertional dyspnea. Worn out getting from parking deck to our office today. No wheeze, cough. Has been worsening over the last 6 months. She is reassured it is not related to her heart based on testing but hopeful to get improvement with upcoming surgery and increasing exercise.  ? ?EKGs/Labs/Other Studies Reviewed:  ? ?The following studies were reviewed today: ?Echo 12/2020 ?1. Left ventricular ejection  fraction, by estimation, is 60 to 65%. The  ?left ventricle has normal function. The left ventricle has no regional  ?wall motion abnormalities. Left ventricular diastolic parameters were  ?normal.  ? 2. Right ventricular systolic function is normal. The right ventricular  ?size is normal. There is normal pulmonary artery systolic pressure.  ? 3. The mitral valve is normal in structure. No evidence of mitral valve  ?regurgitation. No evidence of mitral stenosis.  ? 4. The aortic valve is normal in structure. Aortic valve regurgitation is  ?trivial. No aortic stenosis is present.  ? 5. The inferior vena cava is normal in size with greater than 50%  ?respiratory variability, suggesting right atrial pressure of 3 mmHg.  ? ?Cardiac CTA 10/2020 ?IMPRESSION: ?1. Coronary calcium score of 0. This was 0 percentile for age and ?sex matched control. ?  ?2. Normal coronary origin with right dominance. ?  ?3. No evidence of CAD. ?  ?CAD-RADS 0. No evidence of CAD (0%). Consider non-atherosclerotic ?causes of chest pain. ? ?EKG:  EKG is ordered today.  The ekg ordered today demonstrates SR 70 bpm with stable RBBB. ? ?Recent Labs: ?10/05/2020: TSH 6.360 ?10/14/2020: BUN 18; Creatinine, Ser 0.91; Potassium 5.0; Sodium 142  ?Recent Lipid Panel ?No results found for: CHOL, TRIG, HDL, CHOLHDL, VLDL, LDLCALC, LDLDIRECT ? ? ?Home Medications  ? ?Current Meds  ?Medication Sig  ? buPROPion (WELLBUTRIN XL) 300  MG 24 hr tablet Take 300 mg by mouth daily.  ? celecoxib (CELEBREX) 200 MG capsule Take 200 mg by mouth daily.  ? clonazePAM (KLONOPIN) 0.5 MG tablet Take 0.25-0.5 mg by mouth 2 (two) times daily as needed for anxiety.  ? escitalopram (LEXAPRO) 10 MG tablet Take 10 mg by mouth daily.  ? loratadine-pseudoephedrine (CLARITIN-D 24-HOUR) 10-240 MG 24 hr tablet Take 1 tablet by mouth daily.  ? Multiple Vitamin (MULTIVITAMIN WITH MINERALS) TABS tablet Take 1 tablet by mouth daily.  ? valACYclovir (VALTREX) 1000 MG tablet Take 1,000 mg by  mouth See admin instructions. 1000mg  twice a day for 2 days as needed for cold sores.  ?  ? ?Review of Systems  ?    ?All other systems reviewed and are otherwise negative except as noted above. ? ?Physical Exam  ?  ?VS:  BP (!) 142/90   Pulse 70   Ht 5\' 4"  (1.626 m)   Wt 187 lb (84.8 kg)   SpO2 98%   BMI 32.10 kg/m?  , BMI Body mass index is 32.1 kg/m?. ? ?Wt Readings from Last 3 Encounters:  ?05/10/21 187 lb (84.8 kg)  ?10/05/20 176 lb 3.2 oz (79.9 kg)  ?04/06/17 167 lb 5.3 oz (75.9 kg)  ?  ? ?GEN: Well nourished, well developed, in no acute distress. ?HEENT: normal. ?Neck: Supple, no JVD, carotid bruits, or masses. ?Cardiac: RRR, no murmurs, rubs, or gallops. No clubbing, cyanosis, edema.  Radials/PT 2+ and equal bilaterally.  ?Respiratory:  Respirations regular and unlabored, clear to auscultation bilaterally. ?GI: Soft, nontender, nondistended. ?MS: No deformity or atrophy. ?Skin: Warm and dry, no rash. ?Neuro:  Strength and sensation are intact. ?Psych: Normal affect. ? ?Assessment & Plan  ?  ?Preop clearance - According to the Revised Cardiac Risk Index (RCRI), her Perioperative Risk of Major Cardiac Event is (%): 0.4. Her Functional Capacity in METs is: 5.07 according to the Duke Activity Status Index (DASI). Acceptable risk for planned procedure without additional cardiovascular testing. Will route to surgical team via Epic fax function. ? ?RBBB - Stable finding by EKG. Continue to monitor with periodic echocardiogram.  ? ?DOE - Likely related to deconditioning. Discussed possible referral to PREP exercise program at [facility] which she will consider when she has recovered from her upcoming surgery. If with persistent dyspnea recommend she discuss possible PFT or referral to pulmonology with her primary care provider.  ?  ? ? ?Disposition: Follow up prn with 06/04/17, MD or APP. ? ?Signed, ?[facility], NP ?05/10/2021, 3:05 PM ?[facility] ?

## 2021-05-10 NOTE — Patient Instructions (Signed)
Medication Instructions:  ?Continue your current medications.  ? ?*If you need a refill on your cardiac medications before your next appointment, please call your pharmacy* ? ? ?Lab Work: ?None ordered today.  ? ? ?Testing/Procedures: ?Your EKG today was stable compared to previous.  ? ? ?Follow-Up: ?At [facility], you and your health needs are our priority.  As part of our continuing mission to provide you with exceptional heart care, we have created designated Provider Care Teams.  These Care Teams include your primary Cardiologist (physician) and Advanced Practice Providers (APPs -  Physician Assistants and Nurse Practitioners) who all work together to provide you with the care you need, when you need it. ? ?We recommend signing up for the patient portal called "MyChart".  Sign up information is provided on this After Visit Summary.  MyChart is used to connect with patients for Virtual Visits (Telemedicine).  Patients are able to view lab/test results, encounter notes, upcoming appointments, etc.  Non-urgent messages can be sent to your provider as well.   ?To learn more about what you can do with MyChart, go to [email or web address].   ? ?Your next appointment:   ?As needed with Dr. Flora Lipps  ? ? ?Other Instructions ? ?Alver Sorrow, NP will send a note to Dr. Yevette Edwards that you are cleared for surgery.  ? ?Heart Healthy Diet Recommendations: ?A low-salt diet is recommended. Meats should be grilled, baked, or boiled. Avoid fried foods. Focus on lean protein sources like fish or chicken with vegetables and fruits. The American Heart Association is a Chief Technology Officer!  American Heart Association Diet and Lifeystyle Recommendations   ? ?Exercise recommendations: ?The American Heart Association recommends 150 minutes of moderate intensity exercise weekly. ?Try 30 minutes of moderate intensity exercise 4-5 times per week. ?This could include walking, jogging, or swimming. ?  ?

## 2021-05-10 NOTE — Pre-Procedure Instructions (Signed)
Surgical Instructions ? ? ? Your procedure is scheduled on Thursday, March 16th. ? Report to [facility] Main Entrance "A" at 10:00 A.M., then check in with the Admitting office. ? Call this number if you have problems the morning of surgery: ? [phone] ? ? If you have any questions prior to your surgery date call [phone]: Open Monday-Friday 8am-4pm ? ? ? Remember: ? Do not eat after midnight the night before your surgery ? ?You may drink clear liquids until 10:00 AM the morning of your surgery.   ?Clear liquids allowed are: Water, Non-Citrus Juices (without pulp), Carbonated Beverages, Clear Tea, Black Coffee Only (NO MILK, CREAM OR POWDERED CREAMER of any kind), and Gatorade. ? ? ?Patient Instructions ? ?The night before surgery:  ?No food after midnight. ONLY clear liquids after midnight ? ?The day of surgery (if you do NOT have diabetes):  ?Drink ONE (1) Pre-Surgery Clear Ensure by 10:00 AM the morning of surgery. Drink in one sitting. Do not sip.  ?This drink was given to you during your hospital  ?pre-op appointment visit. ? ?Nothing else to drink after completing the  ?Pre-Surgery Clear Ensure. ? ? ?       If you have questions, please contact your surgeon?s office.  ? ?  ? Take these medicines the morning of surgery with A SIP OF WATER  ?buPROPion (WELLBUTRIN XL)  ?clonazePAM (KLONOPIN)- if needed ?escitalopram (LEXAPRO) ?valACYclovir (VALTREX)- if needed ? ? ?As of today, STOP taking any Aspirin (unless otherwise instructed by your surgeon) Aleve, Naproxen, Ibuprofen, Motrin, Advil, Goody's, BC's, all herbal medications, fish oil, and all vitamins. ?         ?           ?Do NOT Smoke (Tobacco/Vaping) for 24 hours prior to your procedure. ? ?If you use a CPAP at night, you may bring your mask/headgear for your overnight stay. ?  ?Contacts, glasses, piercing's, hearing aid's, dentures or partials may not be worn into surgery, please bring cases for these belongings.  ?  ?For patients admitted to the  hospital, discharge time will be determined by your treatment team. ?  ?Patients discharged the day of surgery will not be allowed to drive home, and someone needs to stay with them for 24 hours. ? ?NO VISITORS WILL BE ALLOWED IN PRE-OP WHERE PATIENTS ARE PREPPED FOR SURGERY.  ONLY 1 SUPPORT PERSON MAY BE PRESENT IN THE WAITING ROOM WHILE YOU ARE IN SURGERY.  IF YOU ARE TO BE ADMITTED, ONCE YOU ARE IN YOUR ROOM YOU WILL BE ALLOWED TWO (2) VISITORS. (1) VISITOR MAY STAY OVERNIGHT BUT MUST ARRIVE TO THE ROOM BY 8pm.  Minor children may have two parents present. Special consideration for safety and communication needs will be reviewed on a case by case basis. ? ? ?Special instructions:   ?[place]- Preparing For Surgery ? ?Before surgery, you can play an important role. Because skin is not sterile, your skin needs to be as free of germs as possible. You can reduce the number of germs on your skin by washing with CHG (chlorahexidine gluconate) Soap before surgery.  CHG is an antiseptic cleaner which kills germs and bonds with the skin to continue killing germs even after washing.   ? ?Oral Hygiene is also important to reduce your risk of infection.  Remember - BRUSH YOUR TEETH THE MORNING OF SURGERY WITH YOUR REGULAR TOOTHPASTE ? ?Please do not use if you have an allergy to CHG or antibacterial soaps. If your  skin becomes reddened/irritated stop using the CHG.  ?Do not shave (including legs and underarms) for at least 48 hours prior to first CHG shower. It is OK to shave your face. ? ?Please follow these instructions carefully. ?  ?Shower the NIGHT BEFORE SURGERY and the MORNING OF SURGERY ? ?If you chose to wash your hair, wash your hair first as usual with your normal shampoo. ? ?After you shampoo, rinse your hair and body thoroughly to remove the shampoo. ? ?Use CHG Soap as you would any other liquid soap. You can apply CHG directly to the skin and wash gently with a scrungie or a clean washcloth.  ? ?Apply the  CHG Soap to your body ONLY FROM THE NECK DOWN.  Do not use on open wounds or open sores. Avoid contact with your eyes, ears, mouth and genitals (private parts). Wash Face and genitals (private parts)  with your normal soap.  ? ?Wash thoroughly, paying special attention to the area where your surgery will be performed. ? ?Thoroughly rinse your body with warm water from the neck down. ? ?DO NOT shower/wash with your normal soap after using and rinsing off the CHG Soap. ? ?Pat yourself dry with a CLEAN TOWEL. ? ?Wear CLEAN PAJAMAS to bed the night before surgery ? ?Place CLEAN SHEETS on your bed the night before your surgery ? ?DO NOT SLEEP WITH PETS. ? ? ?Day of Surgery: ?Take a shower with CHG soap. ?Do not wear jewelry  ?Do not wear lotions, powders, colognes, or deodorant. ?Do not shave 48 hours prior to surgery.  Men may shave face and neck. ?Do not bring valuables to the hospital.  ? is not responsible for any belongings or valuables. ?Wear Clean/Comfortable clothing the morning of surgery ?Do not apply any deodorants/lotions.   ?Remember to brush your teeth WITH YOUR REGULAR TOOTHPASTE. ?  ?Please read over the following fact sheets that you were given. ? ? ?3 days prior to your procedure or After your COVID test ? ?? You are not required to quarantine however you are required to wear a well-fitting mask when you are out and around people not in your household. If your mask becomes wet or soiled, replace with a new one. ? ?? Wash your hands often with soap and water for 20 seconds or clean your hands with an alcohol-based hand sanitizer that contains at least 60% alcohol. ? ?? Do not share personal items. ? ?? Notify your provider: ? ?o if you are in close contact with someone who has COVID ? ?o or if you develop a fever of 100.4 or greater, sneezing, cough, sore throat, shortness of breath or body aches.   ?

## 2021-05-11 ENCOUNTER — Encounter (HOSPITAL_COMMUNITY): Payer: Self-pay

## 2021-05-11 ENCOUNTER — Encounter (HOSPITAL_COMMUNITY)
Admission: RE | Admit: 2021-05-11 | Discharge: 2021-05-11 | Disposition: A | Discharge status: Home / Self Care | Payer: BC Managed Care – PPO | Source: Ambulatory Visit | Attending: Orthopedic Surgery | Admitting: Orthopedic Surgery

## 2021-05-11 VITALS — BP 138/71 | HR 75 | Temp 97.8°F | Resp 18 | Ht 64.0 in | Wt 185.1 lb

## 2021-05-11 DIAGNOSIS — Z96643 Presence of artificial hip joint, bilateral: Secondary | ICD-10-CM | POA: Insufficient documentation

## 2021-05-11 DIAGNOSIS — E785 Hyperlipidemia, unspecified: Secondary | ICD-10-CM | POA: Diagnosis not present

## 2021-05-11 DIAGNOSIS — M1990 Unspecified osteoarthritis, unspecified site: Secondary | ICD-10-CM | POA: Insufficient documentation

## 2021-05-11 DIAGNOSIS — F1090 Alcohol use, unspecified, uncomplicated: Secondary | ICD-10-CM | POA: Insufficient documentation

## 2021-05-11 DIAGNOSIS — G9520 Unspecified cord compression: Secondary | ICD-10-CM | POA: Insufficient documentation

## 2021-05-11 DIAGNOSIS — M5000 Cervical disc disorder with myelopathy, unspecified cervical region: Secondary | ICD-10-CM | POA: Diagnosis not present

## 2021-05-11 DIAGNOSIS — I4510 Unspecified right bundle-branch block: Secondary | ICD-10-CM | POA: Insufficient documentation

## 2021-05-11 DIAGNOSIS — Z01812 Encounter for preprocedural laboratory examination: Secondary | ICD-10-CM | POA: Insufficient documentation

## 2021-05-11 DIAGNOSIS — E063 Autoimmune thyroiditis: Secondary | ICD-10-CM | POA: Diagnosis not present

## 2021-05-11 DIAGNOSIS — Z01818 Encounter for other preprocedural examination: Secondary | ICD-10-CM

## 2021-05-11 HISTORY — DX: Unspecified osteoarthritis, unspecified site: M19.90

## 2021-05-11 HISTORY — DX: Depression, unspecified: F32.A

## 2021-05-11 HISTORY — DX: Anxiety disorder, unspecified: F41.9

## 2021-05-11 HISTORY — DX: Autoimmune thyroiditis: E06.3

## 2021-05-11 LAB — BASIC METABOLIC PANEL
Anion gap: 9 (ref 5–15)
BUN: 21 mg/dL (ref 8–23)
CO2: 28 mmol/L (ref 22–32)
Calcium: 10.1 mg/dL (ref 8.9–10.3)
Chloride: 104 mmol/L (ref 98–111)
Creatinine, Ser: 0.76 mg/dL (ref 0.44–1.00)
GFR, Estimated: >60 mL/min (ref >60)
Glucose, Bld: 91 mg/dL (ref 70–99)
Potassium: 4.6 mmol/L (ref 3.5–5.1)
Sodium: 141 mmol/L (ref 135–145)

## 2021-05-11 LAB — CBC
HCT: 44.3 % (ref 36.0–46.0)
Hemoglobin: 14.3 g/dL (ref 12.0–15.0)
MCH: 32 pg (ref 26.0–34.0)
MCHC: 32.3 g/dL (ref 30.0–36.0)
MCV: 99.1 fL (ref 80.0–100.0)
Platelets: 286 10*3/uL (ref 150–400)
RBC: 4.47 MIL/uL (ref 3.87–5.11)
RDW: 14.2 % (ref 11.5–15.5)
WBC: 7.3 10*3/uL (ref 4.0–10.5)
nRBC: 0 % (ref 0.0–0.2)

## 2021-05-11 LAB — SURGICAL PCR SCREEN
MRSA, PCR: NEGATIVE
Staphylococcus aureus: POSITIVE — AB

## 2021-05-11 NOTE — Anesthesia Preprocedure Evaluation (Signed)
Anesthesia Evaluation Anesthesia Physical Anesthesia Plan  ASA:   Anesthesia Plan:    Post-op Pain Management:    Induction:   PONV Risk Score and Plan:   Airway Management Planned:   Additional Equipment:   Intra-op Plan:   Post-operative Plan:   Informed Consent:   Plan Discussed with:   Anesthesia Plan Comments: (See PAT note written 05/11/2021 by Myra Gianotti, PA-C. Dr. Lissa Hoard spoke with her during PAT due to her concerns about post-operative cognitive decline and post-operative wound infection. )        Anesthesia Quick Evaluation

## 2021-05-11 NOTE — Progress Notes (Signed)
PCP - Dr. Tally Joe ?Cardiologist - Dr. Gerri Spore O'Neal- clearance obtained ? ?PPM/ICD - denies ? ? ?Chest x-ray - 12/25/16 ?EKG - 05/10/21- not released, office contacted. Interpretation in Gillian Shields, NP note ?Stress Test - 09/05/11 ?ECHO - 12/09/20 ?Cardiac Cath - denies ? ?Sleep Study - denies ? ? ?DM- denies ? ?ASA/Blood Thinner Instructions: n/a ? ? ?ERAS Protcol - yes ?PRE-SURGERY Ensure given at PAT ? ?COVID TEST- n/a, ambulatory surgery ? ? ?Anesthesia review: yes, cardiac hx ? ?Patient denies shortness of breath, fever, cough and chest pain at PAT appointment ? ? ?All instructions explained to the patient, with a verbal understanding of the material. Patient agrees to go over the instructions while at home for a better understanding. Patient also instructed to wear a mask in public for 3 days prior to surgery. The opportunity to ask questions was provided. ?  ?

## 2021-05-11 NOTE — Progress Notes (Signed)
Anesthesia Evaluation: ? Case: [id number] Date/Time: 05/19/21 1245  ? Procedure: ANTERIOR CERVICAL DECOMPRESSION FUSION CERVICAL 3- CERVICAL 4 WITH INSTRUMENTATION AND ALLOGRAFT  ? Anesthesia type: General  ? Pre-op diagnosis: Progressive cervical myelopathy, with an MRI notable for a disc herniation and spinal cord compression noted at C3-4.  ? Location: [facility] OR ROOM 05 / [facility]  ? Surgeons: Estill Bamberg, MD  ? ?  ? ? ?DISCUSSION: Patient is a 61 year old female scheduled for the above procedure. ? ?History includes never smoker, HLD, Hashimoto's thyroiditis, osteoarthritis (left THA 03/13/13, s/p explant for infection and hemiarthroplasty spacer 11/18/15 & conversion to left THA 03/03/16; right THA 03/18/17), back surgery (~[id number]; L5-S1 "anterior and posterior fusion with decompression" 07/27/15 MRI). She had no evidence of CAD on 10/2020 CCTA. Alcohol is listed as 8 standard drinks/week.  ? ?Cardiology preoperative evaluation on 05/10/21 by Gillian Shields, NP. She wrote:  ?"Preop clearance - According to the Revised Cardiac Risk Index (RCRI), her Perioperative Risk of Major Cardiac Event is (%): 0.4. Her Functional Capacity in METs is: 5.07 according to the Duke Activity Status Index (DASI). Acceptable risk for planned procedure without additional cardiovascular testing. Will route to surgical team via Epic fax function..." DOE felt related to deconditioning. Consider PREP exercise program at the [facility] after recovered from surgery, or if persistent discuss further with PCP to consider PFTs and/or pulmonology evaluation in the future.  Normal coronaries, normal LV/RV function, normal PASP, no significant valvular disease on 2022 cardiac testing. ? ?Patient requested anesthesia consult while at PAT as she was scared of post-operative cognitive dysfunction, particularly given a strong family history of dementia. She understand that general anesthesia will be required. Lewie Loron, MD spoke with her during her PAT visit.   ? ?Patient is also fearful of post-operative infection given history of left THA infection requiring IV antibiotic, removal and later replacement of left THA. She has discussed this with Dr. Yevette Edwards, and per posting, he is using allograft. Currently, IV Ancef is ordered on call to OR. ? ?Anesthesia team to evaluate on the day of surgery. ? ?  ?VS: BP 138/71   Pulse 75   Temp 36.6 ?C   Resp 18   Ht 5\' 4"  (1.626 m)   Wt 84 kg   SpO2 100%   BMI 31.77 kg/m?  ? ? ?PROVIDERS: ? , MD is PCP  ?- Tally Joe, MD is cardiologist. Seen 10/05/20 for chest pain with history of RBBB and MVP with previous normal echo. 12/2020 echo showed normal LVEF, no regional wall motion abnormalities, trivial AR, normal MV structure. 10/2020 CCTA showed no CAD with Calcium coronary score of 0. ?- She had [facility] neurology evaluation in Fall 2018 by 04-04-1986, MD for subjective memory loss. Work-up was reassuring with negative brain MRI and EEG. Mild iron deficiency anemia found and advised to follow -up with PCP.  ? ? ?LABS: Labs reviewed: Acceptable for surgery. ?(all labs ordered are listed, but only abnormal results are displayed) ? ?Labs Reviewed  ?SURGICAL PCR SCREEN  ?BASIC METABOLIC PANEL  ?CBC  ? ? ?IMAGES/OTHER: ?MRI Brain 11/23/16 [facility]): ?IMPRESSION:  ?Unremarkable MRI of the brain. ? ?EEG 11/09/16 [facility]): ?MPRESSION  ?Normal EEG in the awake state.   ? ? ?EKG: 10/05/20: ?NSR ?RBBB ? ? ?CV: ?Echo 12/09/20: ?IMPRESSIONS  ? 1. Left ventricular ejection fraction, by estimation, is 60 to 65%. The  ?left ventricle has normal function. The left ventricle has no regional  ?wall motion abnormalities. Left ventricular  diastolic parameters were  ?normal.  ? 2. Right ventricular systolic function is normal. The right ventricular  ?size is normal. There is normal pulmonary artery systolic pressure.  ? 3. The mitral valve is normal in structure. No evidence of mitral valve  ?regurgitation. No evidence of mitral stenosis.   ? 4. The aortic valve is normal in structure. Aortic valve regurgitation is  ?trivial. No aortic stenosis is present.  ? 5. The inferior vena cava is normal in size with greater than 50%  ?respiratory variability, suggesting right atrial pressure of 3 mmHg.  ? ? ?CT Coronary 10/14/20: ?IMPRESSION: ?1. Coronary calcium score of 0. This was 0 percentile for age and ?sex matched control. ?2. Normal coronary origin with right dominance. ?3. No evidence of CAD. ?CAD-RADS 0. No evidence of CAD (0%). Consider non-atherosclerotic ?causes of chest pain. ? ? ?Past Medical History:  ?Diagnosis Date  ? Anxiety   ? Arthritis   ? osteoarthritis  ? Depression   ? Hashimoto's disease   ? Hyperlipidemia   ? RBBB 2013  ? ? ?Past Surgical History:  ?Procedure Laterality Date  ? APPENDECTOMY  1977  ? HIP HARDWARE REMOVAL Left 11/18/2015  ? explant infected left THA 11/18/15, converstion to left THA 02/22/16  ? PARTIAL HYSTERECTOMY  1995  ? pt still has ovaries  ? REIMPLANTATION OF TOTAL HIP Left 03/03/2016  ? SPINAL FUSION  1999  ? L5-S1 lumbar fusion  ? TONSILLECTOMY    ? removed as a teenager  ? TOTAL HIP ARTHROPLASTY Right 03/18/2017  ? TOTAL HIP ARTHROPLASTY Left 03/13/2013  ? ? ?MEDICATIONS: ? buPROPion (WELLBUTRIN XL) 300 MG 24 hr tablet  ? celecoxib (CELEBREX) 200 MG capsule  ? clonazePAM (KLONOPIN) 0.5 MG tablet  ? escitalopram (LEXAPRO) 10 MG tablet  ? loratadine-pseudoephedrine (CLARITIN-D 24-HOUR) 10-240 MG 24 hr tablet  ? Multiple Vitamin (MULTIVITAMIN WITH MINERALS) TABS tablet  ? valACYclovir (VALTREX) 1000 MG tablet  ? ?No current facility-administered medications for this encounter.  ? ? ?Shonna Chock, PA-C ?Surgical Short Stay/Anesthesiology ?[facility] Phone [phone] ?[facility] Phone [phone] ?05/11/2021 11:50 AM ? ? ? ? ? ?

## 2021-05-13 DIAGNOSIS — G959 Disease of spinal cord, unspecified: Secondary | ICD-10-CM | POA: Diagnosis not present

## 2021-05-18 ENCOUNTER — Ambulatory Visit (HOSPITAL_BASED_OUTPATIENT_CLINIC_OR_DEPARTMENT_OTHER): Payer: BC Managed Care – PPO | Admitting: Family

## 2021-05-19 ENCOUNTER — Encounter (HOSPITAL_COMMUNITY): Payer: Self-pay | Admitting: Orthopedic Surgery

## 2021-05-19 ENCOUNTER — Ambulatory Visit (HOSPITAL_COMMUNITY): Payer: BC Managed Care – PPO | Admitting: Vascular Surgery

## 2021-05-19 ENCOUNTER — Other Ambulatory Visit: Payer: Self-pay

## 2021-05-19 ENCOUNTER — Ambulatory Visit (HOSPITAL_COMMUNITY)
Admission: RE | Admit: 2021-05-19 | Discharge: 2021-05-19 | Disposition: A | Discharge status: Home / Self Care | Payer: BC Managed Care – PPO | Attending: Orthopedic Surgery | Admitting: Orthopedic Surgery

## 2021-05-19 ENCOUNTER — Ambulatory Visit (HOSPITAL_COMMUNITY): Payer: BC Managed Care – PPO

## 2021-05-19 ENCOUNTER — Ambulatory Visit: Payer: Self-pay

## 2021-05-19 ENCOUNTER — Ambulatory Visit (HOSPITAL_COMMUNITY): Payer: BC Managed Care – PPO | Admitting: Certified Registered Nurse Anesthetist

## 2021-05-19 ENCOUNTER — Encounter (HOSPITAL_COMMUNITY)
Admission: RE | Disposition: A | Discharge status: Home / Self Care | Payer: Self-pay | Source: Home / Self Care | Attending: Orthopedic Surgery

## 2021-05-19 DIAGNOSIS — M5001 Cervical disc disorder with myelopathy,  high cervical region: Secondary | ICD-10-CM | POA: Insufficient documentation

## 2021-05-19 DIAGNOSIS — F419 Anxiety disorder, unspecified: Secondary | ICD-10-CM | POA: Insufficient documentation

## 2021-05-19 DIAGNOSIS — F32A Depression, unspecified: Secondary | ICD-10-CM | POA: Insufficient documentation

## 2021-05-19 DIAGNOSIS — M5000 Cervical disc disorder with myelopathy, unspecified cervical region: Secondary | ICD-10-CM | POA: Diagnosis not present

## 2021-05-19 DIAGNOSIS — M5021 Other cervical disc displacement,  high cervical region: Secondary | ICD-10-CM | POA: Diagnosis not present

## 2021-05-19 HISTORY — PX: ANTERIOR CERVICAL DECOMP/DISCECTOMY FUSION: SHX1161

## 2021-05-19 SURGERY — ANTERIOR CERVICAL DECOMPRESSION/DISCECTOMY FUSION 1 LEVEL
Anesthesia: General | Site: Spine Cervical

## 2021-05-19 MED ORDER — LIDOCAINE 2% (20 MG/ML) 5 ML SYRINGE
INTRAMUSCULAR | Status: AC
  Filled 2021-05-19: qty 5

## 2021-05-19 MED ORDER — BUPIVACAINE-EPINEPHRINE 0.25% -1:200000 IJ SOLN
INTRAMUSCULAR | Status: DC | PRN
  Administered 2021-05-19: 10 mL

## 2021-05-19 MED ORDER — ROCURONIUM BROMIDE 10 MG/ML (PF) SYRINGE
PREFILLED_SYRINGE | INTRAVENOUS | Status: DC | PRN
Start: 2021-05-19 — End: 2021-05-19
  Administered 2021-05-19: 60 mg via INTRAVENOUS
  Administered 2021-05-19: 20 mg via INTRAVENOUS

## 2021-05-19 MED ORDER — CEFAZOLIN SODIUM-DEXTROSE 2-4 GM/100ML-% IV SOLN
2.0000 g | INTRAVENOUS | Status: AC
  Administered 2021-05-19: 2 g via INTRAVENOUS
  Filled 2021-05-19: qty 100

## 2021-05-19 MED ORDER — POVIDONE-IODINE 7.5 % EX SOLN
Freq: Once | CUTANEOUS | Status: DC
  Filled 2021-05-19: qty 118

## 2021-05-19 MED ORDER — HYDROCODONE-ACETAMINOPHEN 5-325 MG PO TABS
1.0000 | ORAL_TABLET | Freq: Four times a day (QID) | ORAL | 0 refills | Status: AC | PRN
Start: 2021-05-19 — End: 2022-05-19

## 2021-05-19 MED ORDER — OXYCODONE HCL 5 MG/5ML PO SOLN: 5.0000 mg | Freq: Once | ORAL | Status: AC | PRN

## 2021-05-19 MED ORDER — PROPOFOL 10 MG/ML IV BOLUS
INTRAVENOUS | Status: DC | PRN
  Administered 2021-05-19: 160 mg via INTRAVENOUS

## 2021-05-19 MED ORDER — OXYCODONE HCL 5 MG PO TABS
ORAL_TABLET | ORAL | Status: AC
  Filled 2021-05-19: qty 1

## 2021-05-19 MED ORDER — LIDOCAINE 2% (20 MG/ML) 5 ML SYRINGE
INTRAMUSCULAR | Status: DC | PRN
  Administered 2021-05-19: 80 mg via INTRAVENOUS

## 2021-05-19 MED ORDER — OXYCODONE HCL 5 MG PO TABS
5.0000 mg | ORAL_TABLET | Freq: Once | ORAL | Status: AC | PRN
  Administered 2021-05-19: 5 mg via ORAL

## 2021-05-19 MED ORDER — ROCURONIUM BROMIDE 10 MG/ML (PF) SYRINGE
PREFILLED_SYRINGE | INTRAVENOUS | Status: AC
  Filled 2021-05-19: qty 10

## 2021-05-19 MED ORDER — ACETAMINOPHEN 10 MG/ML IV SOLN
INTRAVENOUS | Status: AC
  Filled 2021-05-19: qty 100

## 2021-05-19 MED ORDER — CHLORHEXIDINE GLUCONATE 0.12 % MT SOLN
15.0000 mL | Freq: Once | OROMUCOSAL | Status: AC
  Administered 2021-05-19: 15 mL via OROMUCOSAL
  Filled 2021-05-19: qty 15

## 2021-05-19 MED ORDER — SUGAMMADEX SODIUM 200 MG/2ML IV SOLN
INTRAVENOUS | Status: DC | PRN
  Administered 2021-05-19: 200 mg via INTRAVENOUS

## 2021-05-19 MED ORDER — EPHEDRINE 5 MG/ML INJ
INTRAVENOUS | Status: AC
  Filled 2021-05-19: qty 5

## 2021-05-19 MED ORDER — ONDANSETRON HCL 4 MG/2ML IJ SOLN
INTRAMUSCULAR | Status: AC
  Filled 2021-05-19: qty 2

## 2021-05-19 MED ORDER — LACTATED RINGERS IV SOLN: INTRAVENOUS | Status: DC

## 2021-05-19 MED ORDER — DEXAMETHASONE SODIUM PHOSPHATE 10 MG/ML IJ SOLN
INTRAMUSCULAR | Status: AC
  Filled 2021-05-19: qty 1

## 2021-05-19 MED ORDER — METHOCARBAMOL 500 MG PO TABS: 500.0000 mg | ORAL_TABLET | Freq: Four times a day (QID) | ORAL | 0 refills | Status: AC | PRN

## 2021-05-19 MED ORDER — MIDAZOLAM HCL 2 MG/2ML IJ SOLN
INTRAMUSCULAR | Status: AC
  Filled 2021-05-19: qty 2

## 2021-05-19 MED ORDER — THROMBIN 20000 UNITS EX SOLR
CUTANEOUS | Status: DC | PRN
  Administered 2021-05-19: 20000 [IU] via TOPICAL

## 2021-05-19 MED ORDER — FENTANYL CITRATE (PF) 100 MCG/2ML IJ SOLN
INTRAMUSCULAR | Status: DC | PRN
  Administered 2021-05-19: 50 ug via INTRAVENOUS
  Administered 2021-05-19: 100 ug via INTRAVENOUS
  Administered 2021-05-19 (×2): 50 ug via INTRAVENOUS

## 2021-05-19 MED ORDER — DEXAMETHASONE SODIUM PHOSPHATE 10 MG/ML IJ SOLN
INTRAMUSCULAR | Status: DC | PRN
  Administered 2021-05-19: 10 mg via INTRAVENOUS

## 2021-05-19 MED ORDER — PHENYLEPHRINE HCL-NACL 20-0.9 MG/250ML-% IV SOLN
INTRAVENOUS | Status: DC | PRN
  Administered 2021-05-19: 30 ug/min via INTRAVENOUS

## 2021-05-19 MED ORDER — ACETAMINOPHEN 10 MG/ML IV SOLN: 1000.0000 mg | Freq: Once | INTRAVENOUS | Status: DC | PRN

## 2021-05-19 MED ORDER — FENTANYL CITRATE (PF) 250 MCG/5ML IJ SOLN
INTRAMUSCULAR | Status: AC
Start: 2021-05-19 — End: ?
  Filled 2021-05-19: qty 5

## 2021-05-19 MED ORDER — ORAL CARE MOUTH RINSE: 15.0000 mL | Freq: Once | OROMUCOSAL | Status: AC

## 2021-05-19 MED ORDER — PROPOFOL 500 MG/50ML IV EMUL
INTRAVENOUS | Status: DC | PRN
  Administered 2021-05-19 (×2): 150 ug/kg/min via INTRAVENOUS

## 2021-05-19 MED ORDER — METHOCARBAMOL 500 MG PO TABS
ORAL_TABLET | ORAL | Status: AC
  Filled 2021-05-19: qty 1

## 2021-05-19 MED ORDER — ONDANSETRON HCL 4 MG/2ML IJ SOLN
INTRAMUSCULAR | Status: DC | PRN
  Administered 2021-05-19: 4 mg via INTRAVENOUS

## 2021-05-19 MED ORDER — ACETAMINOPHEN 10 MG/ML IV SOLN
INTRAVENOUS | Status: DC | PRN
  Administered 2021-05-19: 1000 mg via INTRAVENOUS

## 2021-05-19 MED ORDER — FENTANYL CITRATE (PF) 100 MCG/2ML IJ SOLN
INTRAMUSCULAR | Status: AC
  Filled 2021-05-19: qty 2

## 2021-05-19 MED ORDER — FENTANYL CITRATE (PF) 100 MCG/2ML IJ SOLN
25.0000 ug | INTRAMUSCULAR | Status: DC | PRN
  Administered 2021-05-19 (×2): 25 ug via INTRAVENOUS

## 2021-05-19 MED ORDER — LACTATED RINGERS IV SOLN
INTRAVENOUS | Status: DC | PRN
Start: 2021-05-19 — End: 2021-05-19

## 2021-05-19 MED ORDER — METHOCARBAMOL 500 MG PO TABS
500.0000 mg | ORAL_TABLET | Freq: Four times a day (QID) | ORAL | Status: DC | PRN
  Administered 2021-05-19: 500 mg via ORAL

## 2021-05-19 MED ORDER — ACETAMINOPHEN 160 MG/5ML PO SOLN: 1000.0000 mg | Freq: Once | ORAL | Status: DC | PRN

## 2021-05-19 MED ORDER — ACETAMINOPHEN 500 MG PO TABS: 1000.0000 mg | ORAL_TABLET | Freq: Once | ORAL | Status: DC | PRN

## 2021-05-19 SURGICAL SUPPLY — 72 items
BAG COUNTER SPONGE SURGICOUNT (BAG) ×2 IMPLANT
BENZOIN TINCTURE PRP APPL 2/3 (GAUZE/BANDAGES/DRESSINGS) ×2 IMPLANT
BIT DRILL NEURO 2X3.1 SFT TUCH (MISCELLANEOUS) ×1 IMPLANT
BIT DRILL SRG 14X2.2XFLT CHK (BIT) IMPLANT
BIT DRL SRG 14X2.2XFLT CHK (BIT) ×1
BLADE CLIPPER SURG (BLADE) ×2 IMPLANT
BLADE SURG 15 STRL LF DISP TIS (BLADE) ×1 IMPLANT
BLADE SURG 15 STRL SS (BLADE) ×1
BONE VIVIGEN FORMABLE 1.3CC (Bone Implant) ×2 IMPLANT
CARTRIDGE OIL MAESTRO DRILL (MISCELLANEOUS) ×1 IMPLANT
CORD BIPOLAR FORCEPS 12FT (ELECTRODE) ×2 IMPLANT
COVER SURGICAL LIGHT HANDLE (MISCELLANEOUS) ×2 IMPLANT
DIFFUSER DRILL AIR PNEUMATIC (MISCELLANEOUS) ×2 IMPLANT
DRAIN JACKSON RD 7FR 3/32 (WOUND CARE) IMPLANT
DRAPE C-ARM 42X72 X-RAY (DRAPES) ×2 IMPLANT
DRAPE POUCH INSTRU U-SHP 10X18 (DRAPES) ×2 IMPLANT
DRAPE SURG 17X23 STRL (DRAPES) ×6 IMPLANT
DRILL BIT SKYLINE 14MM (BIT) ×1
DRILL NEURO 2X3.1 SOFT TOUCH (MISCELLANEOUS) ×2
DURAPREP 26ML APPLICATOR (WOUND CARE) ×2 IMPLANT
ELECT COATED BLADE 2.86 ST (ELECTRODE) ×2 IMPLANT
ELECT REM PT RETURN 9FT ADLT (ELECTROSURGICAL) ×2
ELECTRODE REM PT RTRN 9FT ADLT (ELECTROSURGICAL) ×1 IMPLANT
EVACUATOR SILICONE 100CC (DRAIN) IMPLANT
GAUZE 4X4 16PLY ~~LOC~~+RFID DBL (SPONGE) ×2 IMPLANT
GAUZE SPONGE 4X4 12PLY STRL (GAUZE/BANDAGES/DRESSINGS) ×2 IMPLANT
GLOVE SRG 8 PF TXTR STRL LF DI (GLOVE) ×1 IMPLANT
GLOVE SURG ENC MOIS LTX SZ6.5 (GLOVE) ×2 IMPLANT
GLOVE SURG ENC MOIS LTX SZ8 (GLOVE) ×2 IMPLANT
GLOVE SURG UNDER POLY LF SZ7 (GLOVE) ×4 IMPLANT
GLOVE SURG UNDER POLY LF SZ8 (GLOVE) ×1
GOWN STRL REUS W/ TWL LRG LVL3 (GOWN DISPOSABLE) ×1 IMPLANT
GOWN STRL REUS W/ TWL XL LVL3 (GOWN DISPOSABLE) ×1 IMPLANT
GOWN STRL REUS W/TWL LRG LVL3 (GOWN DISPOSABLE) ×1
GOWN STRL REUS W/TWL XL LVL3 (GOWN DISPOSABLE) ×1
GRAFT BNE MATRIX VG FRMBL SM 1 (Bone Implant) IMPLANT
INTERLOCK LRDTC CRVCL VBR 7MM (Bone Implant) IMPLANT
IV CATH 14GX2 1/4 (CATHETERS) ×2 IMPLANT
KIT BASIN OR (CUSTOM PROCEDURE TRAY) ×2 IMPLANT
KIT TURNOVER KIT B (KITS) ×2 IMPLANT
LORDOTIC CERVICAL VBR 7MM SM (Bone Implant) ×2 IMPLANT
MANIFOLD NEPTUNE II (INSTRUMENTS) ×2 IMPLANT
NDL PRECISIONGLIDE 27X1.5 (NEEDLE) ×1 IMPLANT
NDL SPNL 20GX3.5 QUINCKE YW (NEEDLE) ×1 IMPLANT
NEEDLE PRECISIONGLIDE 27X1.5 (NEEDLE) ×2 IMPLANT
NEEDLE SPNL 20GX3.5 QUINCKE YW (NEEDLE) ×2 IMPLANT
NS IRRIG 1000ML POUR BTL (IV SOLUTION) ×2 IMPLANT
OIL CARTRIDGE MAESTRO DRILL (MISCELLANEOUS) ×2
PACK ORTHO CERVICAL (CUSTOM PROCEDURE TRAY) ×2 IMPLANT
PAD ARMBOARD 7.5X6 YLW CONV (MISCELLANEOUS) ×4 IMPLANT
PATTIES SURGICAL .5 X.5 (GAUZE/BANDAGES/DRESSINGS) IMPLANT
PATTIES SURGICAL .5 X1 (DISPOSABLE) IMPLANT
PIN DISTRACTION 14 (PIN) ×2 IMPLANT
PLATE SKYLINE 12MM (Plate) ×1 IMPLANT
POSITIONER HEAD DONUT 9IN (MISCELLANEOUS) ×2 IMPLANT
SCREW SKYLINE VAR OS 14MM (Screw) ×4 IMPLANT
SPONGE INTESTINAL PEANUT (DISPOSABLE) ×2 IMPLANT
SPONGE SURGIFOAM ABS GEL 100 (HEMOSTASIS) IMPLANT
STRIP CLOSURE SKIN 1/2X4 (GAUZE/BANDAGES/DRESSINGS) ×2 IMPLANT
SURGIFLO W/THROMBIN 8M KIT (HEMOSTASIS) IMPLANT
SUT MNCRL AB 4-0 PS2 18 (SUTURE) ×2 IMPLANT
SUT SILK 4 0 (SUTURE)
SUT SILK 4-0 18XBRD TIE 12 (SUTURE) IMPLANT
SUT VIC AB 2-0 CT2 18 VCP726D (SUTURE) ×2 IMPLANT
SYR BULB IRRIG 60ML STRL (SYRINGE) ×2 IMPLANT
SYR CONTROL 10ML LL (SYRINGE) ×4 IMPLANT
TAPE CLOTH 4X10 WHT NS (GAUZE/BANDAGES/DRESSINGS) ×2 IMPLANT
TAPE UMBILICAL COTTON 1/8X30 (MISCELLANEOUS) ×2 IMPLANT
TOWEL GREEN STERILE (TOWEL DISPOSABLE) ×2 IMPLANT
TOWEL GREEN STERILE FF (TOWEL DISPOSABLE) ×2 IMPLANT
WATER STERILE IRR 1000ML POUR (IV SOLUTION) ×2 IMPLANT
YANKAUER SUCT BULB TIP NO VENT (SUCTIONS) ×2 IMPLANT

## 2021-05-19 NOTE — Anesthesia Procedure Notes (Signed)
Procedure Name: Intubation ?Date/Time: 05/19/2021 12:32 PM ?Performed by: Pearson Grippe, CRNA ?Pre-anesthesia Checklist: Patient identified, Emergency Drugs available, Suction available and Patient being monitored ?Patient Re-evaluated:Patient Re-evaluated prior to induction ?Oxygen Delivery Method: Circle system utilized ?Preoxygenation: Pre-oxygenation with 100% oxygen ?Induction Type: IV induction ?Ventilation: Mask ventilation without difficulty ?Laryngoscope Size: Glidescope and 4 ?Grade View: Grade I ?Tube type: Oral ?Tube size: 7.0 mm ?Number of attempts: 1 ?Airway Equipment and Method: Stylet and Video-laryngoscopy ?Placement Confirmation: ETT inserted through vocal cords under direct vision, positive ETCO2 and breath sounds checked- equal and bilateral ?Secured at: 21 cm ?Tube secured with: Tape ?Dental Injury: Teeth and Oropharynx as per pre-operative assessment  ?Comments: Elective glidescope intubation d/t symptomatic cervical myelopathy ? ? ? ? ?

## 2021-05-19 NOTE — H&P (Signed)
? ? ? ?PREOPERATIVE H&P ? ?Chief Complaint: Balance deterioration ? ?HPI: ?Erica Rowland is a 61 y.o. female who presents with ongoing and progressive deterioration in balance ? ?MRI reveals a disc herniation and spinal cord compression identified at C3-4 ? ?Patient has failed multiple forms of conservative care and continues to have pain (see office notes for additional details regarding the patient's full course of treatment) ? ?Past Medical History:  ?Diagnosis Date  ? Anxiety   ? Arthritis   ? osteoarthritis  ? Depression   ? Hashimoto's disease   ? Hyperlipidemia   ? RBBB 2013  ? ?Past Surgical History:  ?Procedure Laterality Date  ? APPENDECTOMY  1977  ? HIP HARDWARE REMOVAL Left 11/18/2015  ? explant infected left THA 11/18/15, converstion to left THA 02/22/16  ? PARTIAL HYSTERECTOMY  1995  ? pt still has ovaries  ? REIMPLANTATION OF TOTAL HIP Left 03/03/2016  ? SPINAL FUSION  1999  ? L5-S1 lumbar fusion  ? TONSILLECTOMY    ? removed as a teenager  ? TOTAL HIP ARTHROPLASTY Right 03/18/2017  ? TOTAL HIP ARTHROPLASTY Left 03/13/2013  ? ?Social History  ? ?Socioeconomic History  ? Marital status: Married  ?  Spouse name: Not on file  ? Number of children: 0  ? Years of education: Not on file  ? Highest education level: Not on file  ?Occupational History  ? Occupation: Airline pilot  ? Occupation: Librarian, academic  ?Tobacco Use  ? Smoking status: Never  ? Smokeless tobacco: Never  ?Vaping Use  ? Vaping Use: Never used  ?Substance and Sexual Activity  ? Alcohol use: Yes  ?  Alcohol/week: 8.0 standard drinks  ?  Types: 8 Glasses of wine per week  ? Drug use: No  ? Sexual activity: Not on file  ?Other Topics Concern  ? Not on file  ?Social History Narrative  ? Not on file  ? ?Social Determinants of Health  ? ?Financial Resource Strain: Not on file  ?Food Insecurity: Not on file  ?Transportation Needs: Not on file  ?Physical Activity: Not on file  ?Stress: Not on file  ?Social Connections: Not on file  ? ?Family History   ?Problem Relation Age of Onset  ? Dementia Mother   ? Heart attack Mother   ? Cancer Father   ? Heart attack Maternal Grandmother   ? Heart attack Maternal Grandfather   ? ?Allergies  ?Allergen Reactions  ? Rifampin Itching  ?  Hands and feet were swelling and itching.  ? ?Prior to Admission medications   ?Medication Sig Start Date End Date Taking? Authorizing Provider  ?buPROPion (WELLBUTRIN XL) 300 MG 24 hr tablet Take 300 mg by mouth daily. 05/04/10  Yes [provider]  ?celecoxib (CELEBREX) 200 MG capsule Take 200 mg by mouth daily. 04/11/16  Yes [provider]  ?clonazePAM (KLONOPIN) 0.5 MG tablet Take 0.25-0.5 mg by mouth 2 (two) times daily as needed for anxiety.   Yes [provider]  ?escitalopram (LEXAPRO) 10 MG tablet Take 10 mg by mouth daily. 05/20/15  Yes [provider]  ?loratadine-pseudoephedrine (CLARITIN-D 24-HOUR) 10-240 MG 24 hr tablet Take 1 tablet by mouth daily.   Yes [provider]  ?Multiple Vitamin (MULTIVITAMIN WITH MINERALS) TABS tablet Take 1 tablet by mouth daily.   Yes [provider]  ?valACYclovir (VALTREX) 1000 MG tablet Take 1,000 mg by mouth See admin instructions. 1000mg  twice a day for 2 days as needed for cold sores.   Yes  [provider]  ? ? ? ?All other systems have been reviewed and were otherwise negative with the exception of those mentioned in the HPI and as above. ? ?Physical Exam: ?There were no vitals filed for this visit. ? ?There is no height or weight on file to calculate BMI. ? ?General: Alert, no acute distress ?Cardiovascular: No pedal edema ?Respiratory: No cyanosis, no use of accessory musculature ?Skin: No lesions in the area of chief complaint ?Neurologic: Sensation intact distally ?Psychiatric: Patient is competent for consent with normal mood and affect ?Lymphatic: No axillary or cervical lymphadenopathy ? ? ?Assessment/Plan: ?Progressive cervical myelopathy, with an MRI notable for a disc  herniation and spinal cord compression noted at C3-4. ?Plan for Procedure(s): ?ANTERIOR CERVICAL DECOMPRESSION FUSION CERVICAL 3- CERVICAL 4 WITH INSTRUMENTATION AND ALLOGRAFT ? ? ?Jackelyn Hoehn, MD ?05/19/2021 ?7:45 AM  ?

## 2021-05-19 NOTE — Op Note (Signed)
PATIENT NAME: Erica Rowland  ? ?MEDICAL RECORD NO.:   [id number]  ?  ?DATE OF BIRTH: Oct 25, 1960  ?  ?DATE OF PROCEDURE: 05/19/2021 ? ?  ?                            OPERATIVE REPORT ?  ?  ?PREOPERATIVE DIAGNOSES: ?1. Progressive cervical myelopathy ?2. C3/4 disc herniation and spinal cord compression ?  ?POSTOPERATIVE DIAGNOSES: ?1. Progressive cervical myelopathy ?2. C3/4 disc herniation and spinal cord compression ?  ?PROCEDURE: ?1. Anterior cervical decompression and fusion C3/4 ?2. Placement of anterior instrumentation, C3/4 ?3. Insertion of interbody device x1 (32mm Titan intervertebral spacer). ?4. Intraoperative use of fluoroscopy. ?5. Use of morselized allograft - ViviGen. ?  ?SURGEON:  Estill Bamberg, MD ?  ?ASSISTANT:  Jason Coop, PA-C. ?  ?ANESTHESIA:  General endotracheal anesthesia. ?  ?COMPLICATIONS:  None. ?  ?DISPOSITION:  Stable. ?  ?ESTIMATED BLOOD LOSS:  Minimal. ?  ?INDICATIONS FOR SURGERY:  Briefly, Erica Rowland  is a pleasant 59 -year- ?old female, who did present with progressive deterioration in balance and fine motor skills. ?The patient's MRI did reveal the findings noted above.  Given the ?ongoing and progressive symptoms, we did discuss proceeding with the ?procedure noted above.  The patient was fully aware of the risks and ?limitations of surgery as outlined in my preoperative note. ?  ?OPERATIVE DETAILS:  On 05/19/2021, the patient was brought to ?surgery and general endotracheal anesthesia was administered.  The ?patient was placed supine on the hospital bed. The neck was gently ?extended.  All bony prominences were meticulously padded.  The neck was ?prepped and draped in the usual sterile fashion.  At this point, I did ?make a left-sided transverse incision.  The platysma was incised.  A ?Smith-Robinson approach was used and the anterior spine was identified. ?A self-retaining retractor was placed.  I then subperiosteally exposed ?the vertebral bodies from C3/4. Caspar pins were then  placed into the ?C3 and C4 vertebral bodies and distraction was applied.  A thorough and ?complete C3/4 intervertebral diskectomy was performed.  The posterior ?longitudinal ligament was identified and entered using a nerve hook.  I ?then used #1 followed by #2 Kerrison to perform a thorough and complete ?intervertebral diskectomy.  The spinal canal was thoroughly ?decompressed, as was the right and left neuroforamen.  The endplates were then ?prepared and the appropriate-sized intervertebral spacer was then packed ?with ViviGen and tamped into position in the usual fashion. The Caspar pins  ?then were removed and bone wax was placed in their place.  The appropriate-sized ?anterior cervical plate was placed over the anterior spine.  14 mm ?variable angle screws were placed, 2 in each vertebral body, ?for a total of 4 vertebral body screws.  The screws were then locked to ?the plate using the Cam locking mechanism.  I was very pleased with the ?final fluoroscopic images.  The wound was then irrigated.  The wound was ?then explored for any undue bleeding and there was no bleeding noted. ?The wound was then closed in layers using 2-0 Vicryl, followed by 4-0 ?Monocryl.  Benzoin and Steri-Strips were applied, followed by sterile ?dressing.  All instrument counts were correct at the termination of the ?procedure. ?  ?Of note, Jason Coop, PA-C, was my assistant throughout surgery, and ?did aid in retraction, placement of the hardware, suctioning, and closure from start to finish. ?  ?  ?  ?Estill Bamberg,  MD  ?

## 2021-05-19 NOTE — Transfer of Care (Signed)
Immediate Anesthesia Transfer of Care Note ? ?Patient: Erica Rowland ? ?Procedure(s) Performed: ANTERIOR CERVICAL DECOMPRESSION FUSION CERVICAL 3- CERVICAL 4 WITH INSTRUMENTATION AND ALLOGRAFT (Spine Cervical) ? ?Patient Location: PACU ? ?Anesthesia Type:General ? ?Level of Consciousness: awake, alert  and oriented ? ?Airway & Oxygen Therapy: Patient Spontanous Breathing and Patient connected to face mask oxygen ? ?Post-op Assessment: Report given to RN and Post -op Vital signs reviewed and stable ? ?Post vital signs: Reviewed and stable ? ?Last Vitals:  ?Vitals Value Taken Time  ?BP 143/86 05/19/21 1405  ?Temp    ?Pulse 72 05/19/21 1410  ?Resp 13 05/19/21 1410  ?SpO2 98 % 05/19/21 1410  ?Vitals shown include unvalidated device data. ? ?Last Pain:  ?Vitals:  ? 05/19/21 1025  ?TempSrc:   ?PainSc: 0-No pain  ?   ? ?  ? ?Complications: No notable events documented. ?

## 2021-05-22 NOTE — Anesthesia Postprocedure Evaluation (Signed)
Anesthesia Post Note ? ?Patient: Erica Rowland ? ?Procedure(s) Performed: ANTERIOR CERVICAL DECOMPRESSION FUSION CERVICAL 3- CERVICAL 4 WITH INSTRUMENTATION AND ALLOGRAFT (Spine Cervical) ? ?  ? ?Patient location during evaluation: PACU ?Anesthesia Type: General ?Level of consciousness: awake and alert ?Pain management: pain level controlled ?Vital Signs Assessment: post-procedure vital signs reviewed and stable ?Respiratory status: spontaneous breathing, nonlabored ventilation, respiratory function stable and patient connected to nasal cannula oxygen ?Cardiovascular status: blood pressure returned to baseline and stable ?Postop Assessment: no apparent nausea or vomiting ?Anesthetic complications: no ? ? ?No notable events documented. ? ?Last Vitals:  ?Vitals:  ? 05/19/21 1445 05/19/21 1500  ?BP: (!) 171/82 (!) 183/89  ?Pulse: 70 74  ?Resp: 12 17  ?Temp:  36.5 ?C  ?SpO2: 93% 97%  ?  ?Last Pain:  ?Vitals:  ? 05/19/21 1430  ?TempSrc:   ?PainSc: 4   ? ? ?  ?  ?  ?  ?  ?  ? ?Nelle Don [name] ? ? ? ? ?

## 2021-05-24 ENCOUNTER — Encounter (HOSPITAL_COMMUNITY): Payer: Self-pay | Admitting: Orthopedic Surgery

## 2021-06-01 DIAGNOSIS — M5001 Cervical disc disorder with myelopathy,  high cervical region: Secondary | ICD-10-CM | POA: Diagnosis not present

## 2021-06-01 DIAGNOSIS — Z9889 Other specified postprocedural states: Secondary | ICD-10-CM | POA: Diagnosis not present

## 2021-06-23 DIAGNOSIS — G4700 Insomnia, unspecified: Secondary | ICD-10-CM | POA: Diagnosis not present

## 2021-06-23 DIAGNOSIS — I10 Essential (primary) hypertension: Secondary | ICD-10-CM | POA: Diagnosis not present

## 2021-06-23 DIAGNOSIS — Z8639 Personal history of other endocrine, nutritional and metabolic disease: Secondary | ICD-10-CM | POA: Diagnosis not present

## 2021-06-23 DIAGNOSIS — F419 Anxiety disorder, unspecified: Secondary | ICD-10-CM | POA: Diagnosis not present

## 2021-06-23 DIAGNOSIS — E7800 Pure hypercholesterolemia, unspecified: Secondary | ICD-10-CM | POA: Diagnosis not present

## 2021-06-23 DIAGNOSIS — F3341 Major depressive disorder, recurrent, in partial remission: Secondary | ICD-10-CM | POA: Diagnosis not present

## 2021-06-28 DIAGNOSIS — M5001 Cervical disc disorder with myelopathy,  high cervical region: Secondary | ICD-10-CM | POA: Diagnosis not present

## 2021-06-28 DIAGNOSIS — M79605 Pain in left leg: Secondary | ICD-10-CM | POA: Diagnosis not present

## 2021-06-28 DIAGNOSIS — Z9889 Other specified postprocedural states: Secondary | ICD-10-CM | POA: Diagnosis not present

## 2021-07-01 DIAGNOSIS — H10501 Unspecified blepharoconjunctivitis, right eye: Secondary | ICD-10-CM | POA: Diagnosis not present

## 2021-07-07 DIAGNOSIS — M25512 Pain in left shoulder: Secondary | ICD-10-CM | POA: Diagnosis not present

## 2021-07-07 DIAGNOSIS — M2560 Stiffness of unspecified joint, not elsewhere classified: Secondary | ICD-10-CM | POA: Diagnosis not present

## 2021-07-07 DIAGNOSIS — M5451 Vertebrogenic low back pain: Secondary | ICD-10-CM | POA: Diagnosis not present

## 2021-07-07 DIAGNOSIS — M67912 Unspecified disorder of synovium and tendon, left shoulder: Secondary | ICD-10-CM | POA: Diagnosis not present

## 2021-07-07 DIAGNOSIS — R262 Difficulty in walking, not elsewhere classified: Secondary | ICD-10-CM | POA: Diagnosis not present

## 2021-08-02 DIAGNOSIS — Z03818 Encounter for observation for suspected exposure to other biological agents ruled out: Secondary | ICD-10-CM | POA: Diagnosis not present

## 2021-08-02 DIAGNOSIS — R051 Acute cough: Secondary | ICD-10-CM | POA: Diagnosis not present

## 2021-08-02 DIAGNOSIS — R52 Pain, unspecified: Secondary | ICD-10-CM | POA: Diagnosis not present

## 2021-08-02 DIAGNOSIS — R509 Fever, unspecified: Secondary | ICD-10-CM | POA: Diagnosis not present

## 2021-08-02 DIAGNOSIS — B349 Viral infection, unspecified: Secondary | ICD-10-CM | POA: Diagnosis not present

## 2021-08-08 DIAGNOSIS — Z9889 Other specified postprocedural states: Secondary | ICD-10-CM | POA: Diagnosis not present

## 2021-08-09 DIAGNOSIS — J40 Bronchitis, not specified as acute or chronic: Secondary | ICD-10-CM | POA: Diagnosis not present

## 2021-08-16 DIAGNOSIS — D1722 Benign lipomatous neoplasm of skin and subcutaneous tissue of left arm: Secondary | ICD-10-CM | POA: Diagnosis not present

## 2021-08-16 DIAGNOSIS — L814 Other melanin hyperpigmentation: Secondary | ICD-10-CM | POA: Diagnosis not present

## 2021-08-16 DIAGNOSIS — L298 Other pruritus: Secondary | ICD-10-CM | POA: Diagnosis not present

## 2021-08-16 DIAGNOSIS — Z789 Other specified health status: Secondary | ICD-10-CM | POA: Diagnosis not present

## 2021-08-16 DIAGNOSIS — L821 Other seborrheic keratosis: Secondary | ICD-10-CM | POA: Diagnosis not present

## 2021-08-16 DIAGNOSIS — Z08 Encounter for follow-up examination after completed treatment for malignant neoplasm: Secondary | ICD-10-CM | POA: Diagnosis not present

## 2021-08-16 DIAGNOSIS — L820 Inflamed seborrheic keratosis: Secondary | ICD-10-CM | POA: Diagnosis not present

## 2021-10-06 DIAGNOSIS — F419 Anxiety disorder, unspecified: Secondary | ICD-10-CM | POA: Diagnosis not present

## 2021-10-06 DIAGNOSIS — F3341 Major depressive disorder, recurrent, in partial remission: Secondary | ICD-10-CM | POA: Diagnosis not present

## 2021-10-06 DIAGNOSIS — I10 Essential (primary) hypertension: Secondary | ICD-10-CM | POA: Diagnosis not present

## 2021-10-12 DIAGNOSIS — I10 Essential (primary) hypertension: Secondary | ICD-10-CM | POA: Diagnosis not present

## 2021-10-12 DIAGNOSIS — R5383 Other fatigue: Secondary | ICD-10-CM | POA: Diagnosis not present

## 2021-10-12 DIAGNOSIS — F3341 Major depressive disorder, recurrent, in partial remission: Secondary | ICD-10-CM | POA: Diagnosis not present

## 2021-10-26 DIAGNOSIS — R632 Polyphagia: Secondary | ICD-10-CM | POA: Diagnosis not present

## 2021-10-26 DIAGNOSIS — E8889 Other specified metabolic disorders: Secondary | ICD-10-CM | POA: Diagnosis not present

## 2021-10-26 DIAGNOSIS — F3341 Major depressive disorder, recurrent, in partial remission: Secondary | ICD-10-CM | POA: Diagnosis not present

## 2021-10-26 DIAGNOSIS — I10 Essential (primary) hypertension: Secondary | ICD-10-CM | POA: Diagnosis not present

## 2021-11-15 DIAGNOSIS — I10 Essential (primary) hypertension: Secondary | ICD-10-CM | POA: Diagnosis not present

## 2021-11-15 DIAGNOSIS — F3341 Major depressive disorder, recurrent, in partial remission: Secondary | ICD-10-CM | POA: Diagnosis not present

## 2021-11-15 DIAGNOSIS — N390 Urinary tract infection, site not specified: Secondary | ICD-10-CM | POA: Diagnosis not present

## 2021-11-15 DIAGNOSIS — M5416 Radiculopathy, lumbar region: Secondary | ICD-10-CM | POA: Diagnosis not present

## 2021-11-22 DIAGNOSIS — M25551 Pain in right hip: Secondary | ICD-10-CM | POA: Diagnosis not present

## 2021-11-22 DIAGNOSIS — M5441 Lumbago with sciatica, right side: Secondary | ICD-10-CM | POA: Diagnosis not present

## 2021-11-28 DIAGNOSIS — M545 Low back pain, unspecified: Secondary | ICD-10-CM | POA: Diagnosis not present

## 2021-12-01 DIAGNOSIS — M5441 Lumbago with sciatica, right side: Secondary | ICD-10-CM | POA: Diagnosis not present

## 2021-12-22 DIAGNOSIS — N3000 Acute cystitis without hematuria: Secondary | ICD-10-CM | POA: Diagnosis not present

## 2021-12-22 DIAGNOSIS — Z90711 Acquired absence of uterus with remaining cervical stump: Secondary | ICD-10-CM | POA: Diagnosis not present

## 2021-12-22 DIAGNOSIS — Z01411 Encounter for gynecological examination (general) (routine) with abnormal findings: Secondary | ICD-10-CM | POA: Diagnosis not present

## 2021-12-23 DIAGNOSIS — M5416 Radiculopathy, lumbar region: Secondary | ICD-10-CM | POA: Diagnosis not present

## 2021-12-28 DIAGNOSIS — R5383 Other fatigue: Secondary | ICD-10-CM | POA: Diagnosis not present

## 2021-12-28 DIAGNOSIS — R6883 Chills (without fever): Secondary | ICD-10-CM | POA: Diagnosis not present

## 2021-12-28 DIAGNOSIS — R0981 Nasal congestion: Secondary | ICD-10-CM | POA: Diagnosis not present

## 2021-12-28 DIAGNOSIS — N390 Urinary tract infection, site not specified: Secondary | ICD-10-CM | POA: Diagnosis not present

## 2021-12-28 DIAGNOSIS — Z03818 Encounter for observation for suspected exposure to other biological agents ruled out: Secondary | ICD-10-CM | POA: Diagnosis not present

## 2021-12-28 DIAGNOSIS — H8112 Benign paroxysmal vertigo, left ear: Secondary | ICD-10-CM | POA: Diagnosis not present

## 2022-01-03 DIAGNOSIS — L304 Erythema intertrigo: Secondary | ICD-10-CM | POA: Diagnosis not present

## 2022-01-03 DIAGNOSIS — Z87440 Personal history of urinary (tract) infections: Secondary | ICD-10-CM | POA: Diagnosis not present

## 2022-01-11 DIAGNOSIS — M5416 Radiculopathy, lumbar region: Secondary | ICD-10-CM | POA: Diagnosis not present

## 2022-01-12 DIAGNOSIS — M5441 Lumbago with sciatica, right side: Secondary | ICD-10-CM | POA: Diagnosis not present

## 2022-01-17 DIAGNOSIS — M6281 Muscle weakness (generalized): Secondary | ICD-10-CM | POA: Diagnosis not present

## 2022-01-17 DIAGNOSIS — M4326 Fusion of spine, lumbar region: Secondary | ICD-10-CM | POA: Diagnosis not present

## 2022-01-17 DIAGNOSIS — M4322 Fusion of spine, cervical region: Secondary | ICD-10-CM | POA: Diagnosis not present

## 2022-01-24 DIAGNOSIS — M6281 Muscle weakness (generalized): Secondary | ICD-10-CM | POA: Diagnosis not present

## 2022-01-24 DIAGNOSIS — M4322 Fusion of spine, cervical region: Secondary | ICD-10-CM | POA: Diagnosis not present

## 2022-01-24 DIAGNOSIS — M4326 Fusion of spine, lumbar region: Secondary | ICD-10-CM | POA: Diagnosis not present

## 2022-02-01 DIAGNOSIS — M6281 Muscle weakness (generalized): Secondary | ICD-10-CM | POA: Diagnosis not present

## 2022-02-01 DIAGNOSIS — M4322 Fusion of spine, cervical region: Secondary | ICD-10-CM | POA: Diagnosis not present

## 2022-02-01 DIAGNOSIS — M4326 Fusion of spine, lumbar region: Secondary | ICD-10-CM | POA: Diagnosis not present

## 2022-02-03 DIAGNOSIS — G959 Disease of spinal cord, unspecified: Secondary | ICD-10-CM | POA: Diagnosis not present

## 2022-02-06 DIAGNOSIS — M47816 Spondylosis without myelopathy or radiculopathy, lumbar region: Secondary | ICD-10-CM | POA: Diagnosis not present

## 2022-02-07 DIAGNOSIS — M4326 Fusion of spine, lumbar region: Secondary | ICD-10-CM | POA: Diagnosis not present

## 2022-02-07 DIAGNOSIS — M6281 Muscle weakness (generalized): Secondary | ICD-10-CM | POA: Diagnosis not present

## 2022-02-07 DIAGNOSIS — M4322 Fusion of spine, cervical region: Secondary | ICD-10-CM | POA: Diagnosis not present

## 2022-02-09 DIAGNOSIS — M6281 Muscle weakness (generalized): Secondary | ICD-10-CM | POA: Diagnosis not present

## 2022-02-09 DIAGNOSIS — M542 Cervicalgia: Secondary | ICD-10-CM | POA: Diagnosis not present

## 2022-02-09 DIAGNOSIS — M4322 Fusion of spine, cervical region: Secondary | ICD-10-CM | POA: Diagnosis not present

## 2022-02-09 DIAGNOSIS — M546 Pain in thoracic spine: Secondary | ICD-10-CM | POA: Diagnosis not present

## 2022-02-09 DIAGNOSIS — M4326 Fusion of spine, lumbar region: Secondary | ICD-10-CM | POA: Diagnosis not present

## 2022-02-11 DIAGNOSIS — M545 Low back pain, unspecified: Secondary | ICD-10-CM | POA: Diagnosis not present

## 2022-02-16 DIAGNOSIS — M6281 Muscle weakness (generalized): Secondary | ICD-10-CM | POA: Diagnosis not present

## 2022-02-16 DIAGNOSIS — M4326 Fusion of spine, lumbar region: Secondary | ICD-10-CM | POA: Diagnosis not present

## 2022-02-16 DIAGNOSIS — M4322 Fusion of spine, cervical region: Secondary | ICD-10-CM | POA: Diagnosis not present

## 2022-02-22 DIAGNOSIS — M47816 Spondylosis without myelopathy or radiculopathy, lumbar region: Secondary | ICD-10-CM | POA: Diagnosis not present

## 2022-03-09 DIAGNOSIS — F3341 Major depressive disorder, recurrent, in partial remission: Secondary | ICD-10-CM | POA: Diagnosis not present

## 2022-03-09 DIAGNOSIS — I10 Essential (primary) hypertension: Secondary | ICD-10-CM | POA: Diagnosis not present

## 2022-03-31 DIAGNOSIS — M48061 Spinal stenosis, lumbar region without neurogenic claudication: Secondary | ICD-10-CM | POA: Diagnosis not present

## 2022-04-14 DIAGNOSIS — Z1231 Encounter for screening mammogram for malignant neoplasm of breast: Secondary | ICD-10-CM | POA: Diagnosis not present

## 2022-04-21 DIAGNOSIS — F411 Generalized anxiety disorder: Secondary | ICD-10-CM | POA: Diagnosis not present

## 2022-04-21 DIAGNOSIS — Z79899 Other long term (current) drug therapy: Secondary | ICD-10-CM | POA: Diagnosis not present

## 2022-04-21 DIAGNOSIS — F319 Bipolar disorder, unspecified: Secondary | ICD-10-CM | POA: Diagnosis not present

## 2022-05-04 DIAGNOSIS — F411 Generalized anxiety disorder: Secondary | ICD-10-CM | POA: Diagnosis not present

## 2022-05-04 DIAGNOSIS — F39 Unspecified mood [affective] disorder: Secondary | ICD-10-CM | POA: Diagnosis not present

## 2022-05-08 DIAGNOSIS — F3341 Major depressive disorder, recurrent, in partial remission: Secondary | ICD-10-CM | POA: Diagnosis not present

## 2022-05-08 DIAGNOSIS — I10 Essential (primary) hypertension: Secondary | ICD-10-CM | POA: Diagnosis not present

## 2022-06-27 ENCOUNTER — Other Ambulatory Visit (HOSPITAL_COMMUNITY): Payer: Self-pay

## 2022-06-27 ENCOUNTER — Other Ambulatory Visit: Payer: Self-pay

## 2022-06-27 MED ORDER — ZEPBOUND 10 MG/0.5ML ~~LOC~~ SOAJ
10.0000 mg | SUBCUTANEOUS | 0 refills | Status: DC
  Filled 2022-06-27: qty 2, 28d supply, fill #0

## 2022-06-27 MED ORDER — ZEPBOUND 12.5 MG/0.5ML ~~LOC~~ SOAJ
12.5000 mg | SUBCUTANEOUS | 0 refills | Status: DC
  Filled 2022-10-16: qty 2, 28d supply, fill #0

## 2022-07-18 ENCOUNTER — Other Ambulatory Visit (HOSPITAL_COMMUNITY): Payer: Self-pay

## 2022-07-18 DIAGNOSIS — Z6829 Body mass index (BMI) 29.0-29.9, adult: Secondary | ICD-10-CM | POA: Diagnosis not present

## 2022-07-18 DIAGNOSIS — R42 Dizziness and giddiness: Secondary | ICD-10-CM | POA: Diagnosis not present

## 2022-07-18 DIAGNOSIS — F3341 Major depressive disorder, recurrent, in partial remission: Secondary | ICD-10-CM | POA: Diagnosis not present

## 2022-07-18 DIAGNOSIS — I10 Essential (primary) hypertension: Secondary | ICD-10-CM | POA: Diagnosis not present

## 2022-07-18 MED ORDER — ZEPBOUND 2.5 MG/0.5ML ~~LOC~~ SOAJ
2.5000 mg | SUBCUTANEOUS | 0 refills | Status: AC
  Filled 2022-07-18 – 2022-07-27 (×3): qty 2, 28d supply, fill #0

## 2022-07-25 ENCOUNTER — Other Ambulatory Visit (HOSPITAL_COMMUNITY): Payer: Self-pay

## 2022-07-27 ENCOUNTER — Other Ambulatory Visit (HOSPITAL_COMMUNITY): Payer: Self-pay

## 2022-07-27 MED ORDER — ZEPBOUND 5 MG/0.5ML ~~LOC~~ SOAJ
5.0000 mg | SUBCUTANEOUS | 0 refills | Status: AC
  Filled 2022-07-27 – 2022-07-28 (×2): qty 2, 28d supply, fill #0

## 2022-07-28 ENCOUNTER — Other Ambulatory Visit: Payer: Self-pay

## 2022-07-28 ENCOUNTER — Other Ambulatory Visit (HOSPITAL_COMMUNITY): Payer: Self-pay

## 2022-08-02 DIAGNOSIS — F39 Unspecified mood [affective] disorder: Secondary | ICD-10-CM | POA: Diagnosis not present

## 2022-08-02 DIAGNOSIS — F411 Generalized anxiety disorder: Secondary | ICD-10-CM | POA: Diagnosis not present

## 2022-08-22 DIAGNOSIS — G479 Sleep disorder, unspecified: Secondary | ICD-10-CM | POA: Diagnosis not present

## 2022-08-22 DIAGNOSIS — F3341 Major depressive disorder, recurrent, in partial remission: Secondary | ICD-10-CM | POA: Diagnosis not present

## 2022-08-22 DIAGNOSIS — I10 Essential (primary) hypertension: Secondary | ICD-10-CM | POA: Diagnosis not present

## 2022-08-22 DIAGNOSIS — E663 Overweight: Secondary | ICD-10-CM | POA: Diagnosis not present

## 2022-08-24 ENCOUNTER — Other Ambulatory Visit (HOSPITAL_COMMUNITY): Payer: Self-pay

## 2022-08-24 MED ORDER — ZEPBOUND 7.5 MG/0.5ML ~~LOC~~ SOAJ
7.5000 mg | SUBCUTANEOUS | 0 refills | Status: AC
  Filled 2022-08-24: qty 2, 28d supply, fill #0

## 2022-09-19 ENCOUNTER — Other Ambulatory Visit (HOSPITAL_COMMUNITY): Payer: Self-pay

## 2022-09-19 MED ORDER — ZEPBOUND 10 MG/0.5ML ~~LOC~~ SOAJ
SUBCUTANEOUS | 0 refills | Status: DC
  Filled 2022-09-19: qty 2, 28d supply, fill #0

## 2022-09-26 ENCOUNTER — Other Ambulatory Visit (HOSPITAL_COMMUNITY): Payer: Self-pay

## 2022-09-27 DIAGNOSIS — R42 Dizziness and giddiness: Secondary | ICD-10-CM | POA: Diagnosis not present

## 2022-09-27 DIAGNOSIS — G959 Disease of spinal cord, unspecified: Secondary | ICD-10-CM | POA: Diagnosis not present

## 2022-09-27 DIAGNOSIS — R350 Frequency of micturition: Secondary | ICD-10-CM | POA: Diagnosis not present

## 2022-09-27 DIAGNOSIS — B001 Herpesviral vesicular dermatitis: Secondary | ICD-10-CM | POA: Diagnosis not present

## 2022-10-13 DIAGNOSIS — S39012D Strain of muscle, fascia and tendon of lower back, subsequent encounter: Secondary | ICD-10-CM | POA: Diagnosis not present

## 2022-10-16 ENCOUNTER — Other Ambulatory Visit (HOSPITAL_COMMUNITY): Payer: Self-pay

## 2022-10-16 MED ORDER — ZEPBOUND 10 MG/0.5ML ~~LOC~~ SOAJ
SUBCUTANEOUS | 0 refills | Status: AC
  Filled 2022-10-16: qty 2, 30d supply, fill #0

## 2022-10-17 ENCOUNTER — Other Ambulatory Visit (HOSPITAL_COMMUNITY): Payer: Self-pay

## 2022-10-31 ENCOUNTER — Other Ambulatory Visit (HOSPITAL_COMMUNITY): Payer: Self-pay

## 2022-10-31 DIAGNOSIS — Z6826 Body mass index (BMI) 26.0-26.9, adult: Secondary | ICD-10-CM | POA: Diagnosis not present

## 2022-10-31 DIAGNOSIS — F39 Unspecified mood [affective] disorder: Secondary | ICD-10-CM | POA: Diagnosis not present

## 2022-10-31 DIAGNOSIS — M546 Pain in thoracic spine: Secondary | ICD-10-CM | POA: Diagnosis not present

## 2022-10-31 DIAGNOSIS — M791 Myalgia, unspecified site: Secondary | ICD-10-CM | POA: Diagnosis not present

## 2022-10-31 DIAGNOSIS — S30861A Insect bite (nonvenomous) of abdominal wall, initial encounter: Secondary | ICD-10-CM | POA: Diagnosis not present

## 2022-10-31 DIAGNOSIS — F411 Generalized anxiety disorder: Secondary | ICD-10-CM | POA: Diagnosis not present

## 2022-10-31 DIAGNOSIS — Z5181 Encounter for therapeutic drug level monitoring: Secondary | ICD-10-CM | POA: Diagnosis not present

## 2022-10-31 DIAGNOSIS — E7800 Pure hypercholesterolemia, unspecified: Secondary | ICD-10-CM | POA: Diagnosis not present

## 2022-10-31 DIAGNOSIS — F331 Major depressive disorder, recurrent, moderate: Secondary | ICD-10-CM | POA: Diagnosis not present

## 2022-10-31 DIAGNOSIS — I10 Essential (primary) hypertension: Secondary | ICD-10-CM | POA: Diagnosis not present

## 2022-10-31 MED ORDER — DOXYCYCLINE HYCLATE 100 MG PO TABS
100.0000 mg | ORAL_TABLET | Freq: Two times a day (BID) | ORAL | 0 refills | Status: AC
  Filled 2022-10-31: qty 20, 10d supply, fill #0

## 2022-10-31 MED ORDER — ZEPBOUND 12.5 MG/0.5ML ~~LOC~~ SOAJ
12.5000 mg | SUBCUTANEOUS | 0 refills | Status: AC
Start: 2022-10-31 — End: ?
  Filled 2022-10-31 – 2022-11-15 (×3): qty 2, 28d supply, fill #0

## 2022-11-01 ENCOUNTER — Other Ambulatory Visit (HOSPITAL_COMMUNITY): Payer: Self-pay

## 2022-11-13 DIAGNOSIS — S39012D Strain of muscle, fascia and tendon of lower back, subsequent encounter: Secondary | ICD-10-CM | POA: Diagnosis not present

## 2022-11-14 ENCOUNTER — Other Ambulatory Visit (HOSPITAL_COMMUNITY): Payer: Self-pay

## 2022-11-14 DIAGNOSIS — S29012D Strain of muscle and tendon of back wall of thorax, subsequent encounter: Secondary | ICD-10-CM | POA: Diagnosis not present

## 2022-11-15 ENCOUNTER — Other Ambulatory Visit (HOSPITAL_COMMUNITY): Payer: Self-pay

## 2022-11-16 ENCOUNTER — Other Ambulatory Visit (HOSPITAL_COMMUNITY): Payer: Self-pay

## 2022-11-20 ENCOUNTER — Other Ambulatory Visit (HOSPITAL_COMMUNITY): Payer: Self-pay

## 2022-11-30 DIAGNOSIS — R239 Unspecified skin changes: Secondary | ICD-10-CM | POA: Diagnosis not present

## 2022-11-30 DIAGNOSIS — S30861D Insect bite (nonvenomous) of abdominal wall, subsequent encounter: Secondary | ICD-10-CM | POA: Diagnosis not present

## 2022-11-30 DIAGNOSIS — W57XXXD Bitten or stung by nonvenomous insect and other nonvenomous arthropods, subsequent encounter: Secondary | ICD-10-CM | POA: Diagnosis not present

## 2022-12-04 DIAGNOSIS — S29012D Strain of muscle and tendon of back wall of thorax, subsequent encounter: Secondary | ICD-10-CM | POA: Diagnosis not present

## 2022-12-06 DIAGNOSIS — S29012D Strain of muscle and tendon of back wall of thorax, subsequent encounter: Secondary | ICD-10-CM | POA: Diagnosis not present

## 2022-12-13 ENCOUNTER — Other Ambulatory Visit (HOSPITAL_COMMUNITY): Payer: Self-pay

## 2022-12-13 MED ORDER — ZEPBOUND 15 MG/0.5ML ~~LOC~~ SOAJ
15.0000 mg | SUBCUTANEOUS | 0 refills | Status: DC
  Filled 2022-12-13: qty 2, 28d supply, fill #0

## 2022-12-27 DIAGNOSIS — F331 Major depressive disorder, recurrent, moderate: Secondary | ICD-10-CM | POA: Diagnosis not present

## 2022-12-27 DIAGNOSIS — F39 Unspecified mood [affective] disorder: Secondary | ICD-10-CM | POA: Diagnosis not present

## 2022-12-27 DIAGNOSIS — F411 Generalized anxiety disorder: Secondary | ICD-10-CM | POA: Diagnosis not present

## 2023-01-23 ENCOUNTER — Other Ambulatory Visit (HOSPITAL_COMMUNITY): Payer: Self-pay

## 2023-01-23 MED ORDER — ZEPBOUND 15 MG/0.5ML ~~LOC~~ SOAJ
15.0000 mg | SUBCUTANEOUS | 0 refills | Status: DC
  Filled 2023-01-23: qty 2, 28d supply, fill #0

## 2023-02-14 DIAGNOSIS — Z1211 Encounter for screening for malignant neoplasm of colon: Secondary | ICD-10-CM | POA: Diagnosis not present

## 2023-02-14 DIAGNOSIS — K648 Other hemorrhoids: Secondary | ICD-10-CM | POA: Diagnosis not present

## 2023-02-16 ENCOUNTER — Other Ambulatory Visit (HOSPITAL_COMMUNITY): Payer: Self-pay

## 2023-02-16 MED ORDER — ZEPBOUND 15 MG/0.5ML ~~LOC~~ SOAJ
15.0000 mg | SUBCUTANEOUS | 0 refills | Status: DC
  Filled 2023-02-16 – 2023-02-21 (×2): qty 2, 28d supply, fill #0

## 2023-02-19 ENCOUNTER — Other Ambulatory Visit (HOSPITAL_COMMUNITY): Payer: Self-pay

## 2023-02-21 ENCOUNTER — Other Ambulatory Visit (HOSPITAL_COMMUNITY): Payer: Self-pay

## 2023-03-12 DIAGNOSIS — F331 Major depressive disorder, recurrent, moderate: Secondary | ICD-10-CM | POA: Diagnosis not present

## 2023-03-12 DIAGNOSIS — F39 Unspecified mood [affective] disorder: Secondary | ICD-10-CM | POA: Diagnosis not present

## 2023-03-12 DIAGNOSIS — F411 Generalized anxiety disorder: Secondary | ICD-10-CM | POA: Diagnosis not present

## 2023-03-15 ENCOUNTER — Encounter (HOSPITAL_COMMUNITY): Payer: Self-pay

## 2023-03-15 ENCOUNTER — Other Ambulatory Visit (HOSPITAL_BASED_OUTPATIENT_CLINIC_OR_DEPARTMENT_OTHER): Payer: Self-pay

## 2023-03-15 ENCOUNTER — Other Ambulatory Visit (HOSPITAL_COMMUNITY): Payer: Self-pay

## 2023-03-15 MED ORDER — ZEPBOUND 15 MG/0.5ML ~~LOC~~ SOAJ
15.0000 mg | SUBCUTANEOUS | 1 refills | Status: AC
Start: 2023-03-15 — End: ?
  Filled 2023-03-15 – 2023-03-26 (×3): qty 2, 28d supply, fill #0
  Filled 2023-04-23: qty 2, 28d supply, fill #1

## 2023-03-16 ENCOUNTER — Other Ambulatory Visit (HOSPITAL_COMMUNITY): Payer: Self-pay

## 2023-03-26 ENCOUNTER — Other Ambulatory Visit (HOSPITAL_COMMUNITY): Payer: Self-pay

## 2023-04-13 DIAGNOSIS — R399 Unspecified symptoms and signs involving the genitourinary system: Secondary | ICD-10-CM | POA: Diagnosis not present

## 2023-04-18 DIAGNOSIS — R310 Gross hematuria: Secondary | ICD-10-CM | POA: Diagnosis not present

## 2023-04-23 ENCOUNTER — Other Ambulatory Visit (HOSPITAL_COMMUNITY): Payer: Self-pay

## 2023-05-01 DIAGNOSIS — F3341 Major depressive disorder, recurrent, in partial remission: Secondary | ICD-10-CM | POA: Diagnosis not present

## 2023-05-01 DIAGNOSIS — F419 Anxiety disorder, unspecified: Secondary | ICD-10-CM | POA: Diagnosis not present

## 2023-05-01 DIAGNOSIS — E7800 Pure hypercholesterolemia, unspecified: Secondary | ICD-10-CM | POA: Diagnosis not present

## 2023-05-01 DIAGNOSIS — I10 Essential (primary) hypertension: Secondary | ICD-10-CM | POA: Diagnosis not present

## 2023-05-16 ENCOUNTER — Other Ambulatory Visit (HOSPITAL_COMMUNITY): Payer: Self-pay

## 2023-05-17 ENCOUNTER — Other Ambulatory Visit: Payer: Self-pay

## 2023-05-17 ENCOUNTER — Other Ambulatory Visit (HOSPITAL_COMMUNITY): Payer: Self-pay

## 2023-05-17 DIAGNOSIS — M542 Cervicalgia: Secondary | ICD-10-CM | POA: Diagnosis not present

## 2023-05-17 DIAGNOSIS — M7912 Myalgia of auxiliary muscles, head and neck: Secondary | ICD-10-CM | POA: Diagnosis not present

## 2023-05-17 MED ORDER — ZEPBOUND 12.5 MG/0.5ML ~~LOC~~ SOAJ
12.5000 mg | SUBCUTANEOUS | 1 refills | Status: DC
  Filled 2023-05-17: qty 2, 28d supply, fill #0
  Filled 2023-06-13: qty 2, 28d supply, fill #1

## 2023-06-13 ENCOUNTER — Other Ambulatory Visit (HOSPITAL_COMMUNITY): Payer: Self-pay

## 2023-06-13 MED ORDER — ZEPBOUND 10 MG/0.5ML ~~LOC~~ SOAJ: 10.0000 mg | SUBCUTANEOUS | 1 refills | Status: AC

## 2023-07-09 ENCOUNTER — Other Ambulatory Visit (HOSPITAL_COMMUNITY): Payer: Self-pay

## 2023-07-09 MED ORDER — ESZOPICLONE 2 MG PO TABS
ORAL_TABLET | ORAL | 0 refills | Status: AC
Start: 2023-07-09 — End: ?
  Filled 2023-07-09: qty 30, 30d supply, fill #0

## 2023-07-09 MED ORDER — DESVENLAFAXINE SUCCINATE ER 100 MG PO TB24
100.0000 mg | ORAL_TABLET | Freq: Every day | ORAL | 2 refills | Status: AC
  Filled 2023-07-09: qty 30, 30d supply, fill #0
  Filled 2023-08-22 – 2023-08-28 (×2): qty 30, 30d supply, fill #1

## 2023-07-09 MED ORDER — ZEPBOUND 12.5 MG/0.5ML ~~LOC~~ SOAJ
SUBCUTANEOUS | 1 refills | Status: AC
  Filled 2023-07-09: qty 2, 28d supply, fill #0
  Filled 2023-12-04: qty 2, 28d supply, fill #1

## 2023-07-10 ENCOUNTER — Other Ambulatory Visit (HOSPITAL_COMMUNITY): Payer: Self-pay

## 2023-07-10 ENCOUNTER — Other Ambulatory Visit: Payer: Self-pay

## 2023-07-16 ENCOUNTER — Other Ambulatory Visit (HOSPITAL_COMMUNITY): Payer: Self-pay

## 2023-08-10 ENCOUNTER — Other Ambulatory Visit (HOSPITAL_COMMUNITY): Payer: Self-pay

## 2023-08-10 ENCOUNTER — Other Ambulatory Visit: Payer: Self-pay

## 2023-08-10 MED ORDER — ZEPBOUND 12.5 MG/0.5ML ~~LOC~~ SOAJ
12.5000 mg | SUBCUTANEOUS | 1 refills | Status: AC
  Filled 2023-08-10: qty 2, 28d supply, fill #0
  Filled 2024-01-08: qty 2, 28d supply, fill #1

## 2023-08-22 ENCOUNTER — Other Ambulatory Visit (HOSPITAL_COMMUNITY): Payer: Self-pay

## 2023-08-28 ENCOUNTER — Other Ambulatory Visit (HOSPITAL_COMMUNITY): Payer: Self-pay

## 2023-09-12 ENCOUNTER — Other Ambulatory Visit: Payer: Self-pay

## 2023-09-12 ENCOUNTER — Other Ambulatory Visit (HOSPITAL_COMMUNITY): Payer: Self-pay

## 2023-09-12 MED ORDER — ZEPBOUND 12.5 MG/0.5ML ~~LOC~~ SOAJ
12.5000 mg | SUBCUTANEOUS | 11 refills | Status: AC
  Filled 2023-09-12: qty 2, 28d supply, fill #0
  Filled 2024-03-28: qty 2, 28d supply, fill #1

## 2023-10-10 ENCOUNTER — Other Ambulatory Visit (HOSPITAL_COMMUNITY): Payer: Self-pay

## 2023-10-10 MED ORDER — ZEPBOUND 12.5 MG/0.5ML ~~LOC~~ SOAJ
12.5000 mg | SUBCUTANEOUS | 11 refills | Status: AC
  Filled 2023-10-10 – 2023-10-11 (×2): qty 2, 28d supply, fill #0
  Filled 2023-11-07: qty 2, 28d supply, fill #1
  Filled 2024-02-04: qty 2, 28d supply, fill #2
  Filled 2024-02-29: qty 2, 28d supply, fill #3

## 2023-10-11 ENCOUNTER — Other Ambulatory Visit (HOSPITAL_COMMUNITY): Payer: Self-pay

## 2023-10-11 ENCOUNTER — Other Ambulatory Visit: Payer: Self-pay

## 2023-10-17 ENCOUNTER — Other Ambulatory Visit (HOSPITAL_COMMUNITY): Payer: Self-pay

## 2023-11-03 IMAGING — RF DG CERVICAL SPINE 1V
1 series · 1 of 1 positions shown · non-contrast
Comparison: None.

CLINICAL DATA: ACDF C3-4

EXAM:
DG CERVICAL SPINE - 1 VIEW; DG C-ARM 1-60 MIN-NO REPORT

[Series 1: run · 1 of 1 slices shown]
[im 1/1]
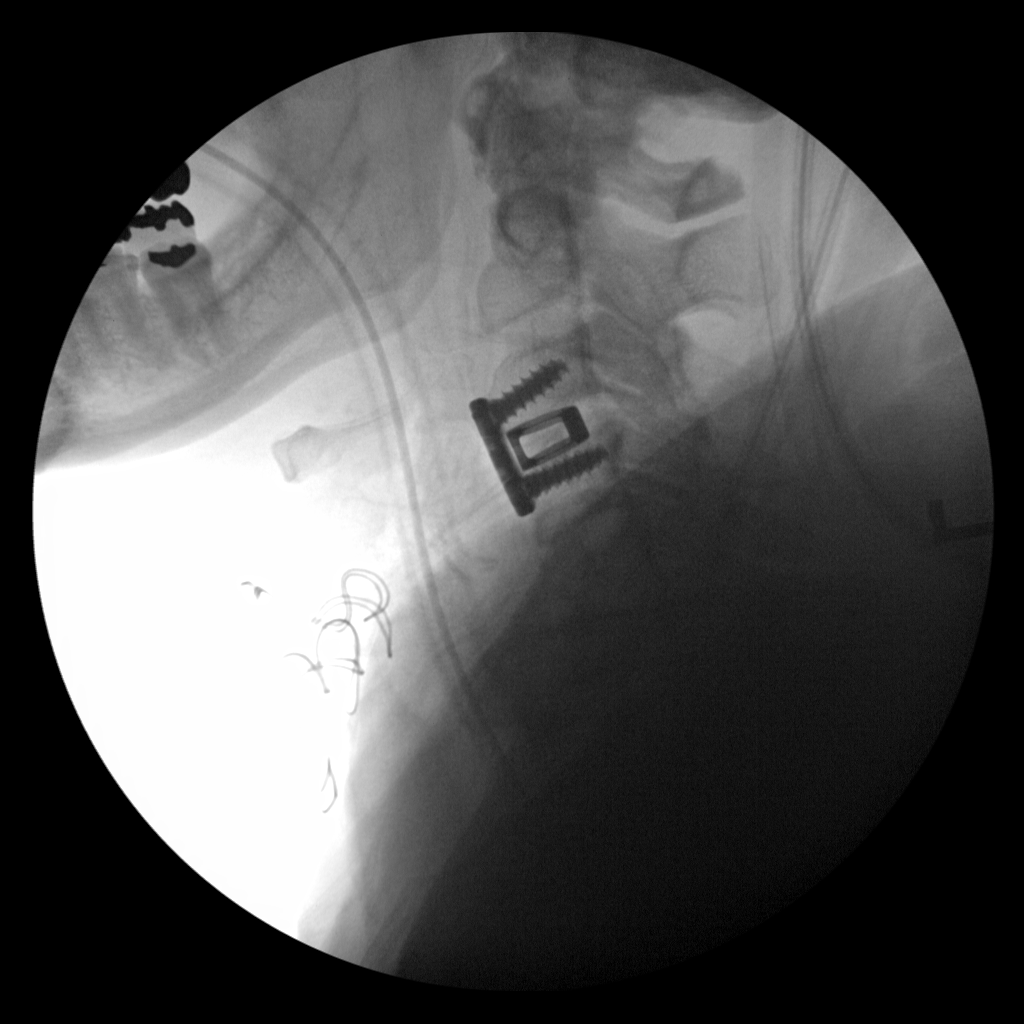

[1 of 1 positions shown; findings below may reference images not displayed]

FLUOROSCOPY:
Exposure Index (as provided by the fluoroscopic device): 0.6 mGy
Kerma
FINDINGS: Nondiagnostic spot fluoroscopic lateral intraoperative cervical
spine radiograph demonstrates postsurgical changes from ACDF C3-4
with bone cage in the C3-4 disc space. Oral route tube enters the
hypopharynx with tip not seen on this image.
IMPRESSION: Intraoperative fluoroscopic guidance for ACDF C3-4.

## 2023-11-07 ENCOUNTER — Other Ambulatory Visit (HOSPITAL_COMMUNITY): Payer: Self-pay

## 2023-12-04 ENCOUNTER — Other Ambulatory Visit (HOSPITAL_COMMUNITY): Payer: Self-pay

## 2024-01-08 ENCOUNTER — Other Ambulatory Visit (HOSPITAL_COMMUNITY): Payer: Self-pay

## 2024-02-04 ENCOUNTER — Other Ambulatory Visit (HOSPITAL_COMMUNITY): Payer: Self-pay

## 2024-02-29 ENCOUNTER — Other Ambulatory Visit (HOSPITAL_COMMUNITY): Payer: Self-pay

## 2024-03-28 ENCOUNTER — Other Ambulatory Visit (HOSPITAL_COMMUNITY): Payer: Self-pay

## 2024-03-28 MED ORDER — TIRZEPATIDE-WEIGHT MANAGEMENT 10 MG/0.5ML ~~LOC~~ SOAJ
10.0000 mg | SUBCUTANEOUS | 2 refills | Status: AC
  Filled 2024-03-28 – 2024-04-07 (×2): qty 2, 28d supply, fill #0

## 2024-04-07 ENCOUNTER — Other Ambulatory Visit (HOSPITAL_COMMUNITY): Payer: Self-pay
